# Patient Record
Sex: Female | Born: 1935 | Race: White | Hispanic: No | Smoking: Former smoker
Health system: Southern US, Community
[De-identification: ages and names within clinical notes are randomized; demographics above are authoritative.]

## PROBLEM LIST (undated history)

## (undated) DIAGNOSIS — E785 Hyperlipidemia, unspecified: Secondary | ICD-10-CM

## (undated) DIAGNOSIS — F419 Anxiety disorder, unspecified: Secondary | ICD-10-CM

## (undated) DIAGNOSIS — I2510 Atherosclerotic heart disease of native coronary artery without angina pectoris: Secondary | ICD-10-CM

## (undated) DIAGNOSIS — K469 Unspecified abdominal hernia without obstruction or gangrene: Secondary | ICD-10-CM

## (undated) DIAGNOSIS — I10 Essential (primary) hypertension: Secondary | ICD-10-CM

## (undated) HISTORY — PX: EXPLORATORY LAPAROTOMY: SUR591

---

## 2004-12-09 ENCOUNTER — Ambulatory Visit: Payer: Self-pay | Admitting: Internal Medicine

## 2004-12-16 ENCOUNTER — Ambulatory Visit: Payer: Self-pay | Admitting: Unknown Physician Specialty

## 2004-12-23 ENCOUNTER — Ambulatory Visit: Payer: Self-pay | Admitting: Gynecologic Oncology

## 2004-12-30 ENCOUNTER — Ambulatory Visit: Payer: Self-pay | Admitting: Unknown Physician Specialty

## 2005-02-17 ENCOUNTER — Ambulatory Visit: Payer: Self-pay | Admitting: Gynecologic Oncology

## 2005-09-08 ENCOUNTER — Ambulatory Visit: Payer: Self-pay | Admitting: Internal Medicine

## 2005-12-02 ENCOUNTER — Ambulatory Visit: Payer: Self-pay | Admitting: Unknown Physician Specialty

## 2007-08-10 ENCOUNTER — Ambulatory Visit: Payer: Self-pay | Admitting: Unknown Physician Specialty

## 2008-07-23 ENCOUNTER — Ambulatory Visit: Payer: Self-pay | Admitting: Internal Medicine

## 2008-08-12 ENCOUNTER — Ambulatory Visit: Payer: Self-pay | Admitting: Internal Medicine

## 2009-08-15 ENCOUNTER — Ambulatory Visit: Payer: Self-pay | Admitting: Internal Medicine

## 2010-08-18 ENCOUNTER — Ambulatory Visit: Payer: Self-pay | Admitting: Unknown Physician Specialty

## 2011-09-17 ENCOUNTER — Ambulatory Visit: Payer: Self-pay | Admitting: Internal Medicine

## 2012-09-19 ENCOUNTER — Ambulatory Visit: Payer: Self-pay | Admitting: Internal Medicine

## 2013-10-09 ENCOUNTER — Ambulatory Visit: Payer: Self-pay | Admitting: Internal Medicine

## 2014-08-10 ENCOUNTER — Emergency Department: Payer: Self-pay | Admitting: Emergency Medicine

## 2014-08-10 LAB — URINALYSIS, COMPLETE
BACTERIA: NONE SEEN
Bilirubin,UR: NEGATIVE
Blood: NEGATIVE
Glucose,UR: NEGATIVE mg/dL (ref 0–75)
KETONE: NEGATIVE
Leukocyte Esterase: NEGATIVE
Nitrite: NEGATIVE
PROTEIN: NEGATIVE
Ph: 6 (ref 4.5–8.0)
RBC, UR: NONE SEEN /HPF (ref 0–5)
SPECIFIC GRAVITY: 1.004 (ref 1.003–1.030)
SQUAMOUS EPITHELIAL: NONE SEEN
WBC UR: NONE SEEN /HPF (ref 0–5)

## 2014-08-10 LAB — CBC WITH DIFFERENTIAL/PLATELET
BASOS ABS: 0.1 10*3/uL (ref 0.0–0.1)
Basophil %: 0.8 %
EOS PCT: 1.8 %
Eosinophil #: 0.2 10*3/uL (ref 0.0–0.7)
HCT: 35.2 % (ref 35.0–47.0)
HGB: 11.9 g/dL — AB (ref 12.0–16.0)
Lymphocyte #: 2.6 10*3/uL (ref 1.0–3.6)
Lymphocyte %: 28.8 %
MCH: 30.6 pg (ref 26.0–34.0)
MCHC: 33.9 g/dL (ref 32.0–36.0)
MCV: 90 fL (ref 80–100)
Monocyte #: 1.1 x10 3/mm — ABNORMAL HIGH (ref 0.2–0.9)
Monocyte %: 11.9 %
NEUTROS ABS: 5 10*3/uL (ref 1.4–6.5)
NEUTROS PCT: 56.7 %
Platelet: 202 10*3/uL (ref 150–440)
RBC: 3.91 10*6/uL (ref 3.80–5.20)
RDW: 14.2 % (ref 11.5–14.5)
WBC: 8.9 10*3/uL (ref 3.6–11.0)

## 2014-08-10 LAB — BASIC METABOLIC PANEL
ANION GAP: 11 (ref 7–16)
BUN: 23 mg/dL — AB (ref 7–18)
CHLORIDE: 97 mmol/L — AB (ref 98–107)
CO2: 22 mmol/L (ref 21–32)
Calcium, Total: 8.8 mg/dL (ref 8.5–10.1)
Creatinine: 1.18 mg/dL (ref 0.60–1.30)
EGFR (Non-African Amer.): 43 — ABNORMAL LOW
GFR CALC AF AMER: 50 — AB
Glucose: 85 mg/dL (ref 65–99)
OSMOLALITY: 264 (ref 275–301)
Potassium: 3.5 mmol/L (ref 3.5–5.1)
SODIUM: 130 mmol/L — AB (ref 136–145)

## 2014-08-10 LAB — TROPONIN I

## 2014-12-03 ENCOUNTER — Ambulatory Visit: Payer: Self-pay | Admitting: Internal Medicine

## 2015-11-06 ENCOUNTER — Other Ambulatory Visit: Payer: Self-pay | Admitting: Internal Medicine

## 2015-11-06 DIAGNOSIS — Z1231 Encounter for screening mammogram for malignant neoplasm of breast: Secondary | ICD-10-CM

## 2015-12-05 ENCOUNTER — Ambulatory Visit: Payer: Self-pay

## 2015-12-17 ENCOUNTER — Other Ambulatory Visit: Payer: Self-pay | Admitting: Internal Medicine

## 2015-12-17 ENCOUNTER — Ambulatory Visit
Admission: RE | Admit: 2015-12-17 | Discharge: 2015-12-17 | Disposition: A | Discharge status: Home / Self Care | Payer: Medicare Other | Source: Ambulatory Visit | Attending: Internal Medicine | Admitting: Internal Medicine

## 2015-12-17 DIAGNOSIS — Z1231 Encounter for screening mammogram for malignant neoplasm of breast: Secondary | ICD-10-CM | POA: Diagnosis not present

## 2017-04-06 ENCOUNTER — Other Ambulatory Visit: Payer: Self-pay | Admitting: Internal Medicine

## 2017-04-06 DIAGNOSIS — Z1231 Encounter for screening mammogram for malignant neoplasm of breast: Secondary | ICD-10-CM

## 2017-04-25 ENCOUNTER — Inpatient Hospital Stay
Admission: RE | Admit: 2017-04-25 | Discharge: 2017-04-25 | Disposition: A | Discharge status: Home / Self Care | Payer: Self-pay | Source: Ambulatory Visit | Attending: *Deleted | Admitting: *Deleted

## 2017-04-25 ENCOUNTER — Ambulatory Visit
Admission: RE | Admit: 2017-04-25 | Discharge: 2017-04-25 | Disposition: A | Discharge status: Home / Self Care | Payer: Medicare Other | Source: Ambulatory Visit | Attending: Internal Medicine | Admitting: Internal Medicine

## 2017-04-25 ENCOUNTER — Other Ambulatory Visit: Payer: Self-pay | Admitting: *Deleted

## 2017-04-25 DIAGNOSIS — Z9289 Personal history of other medical treatment: Secondary | ICD-10-CM

## 2017-04-25 DIAGNOSIS — Z1231 Encounter for screening mammogram for malignant neoplasm of breast: Secondary | ICD-10-CM | POA: Diagnosis not present

## 2018-02-27 ENCOUNTER — Other Ambulatory Visit: Payer: Self-pay

## 2018-02-27 ENCOUNTER — Emergency Department
Admission: EM | Admit: 2018-02-27 | Discharge: 2018-02-28 | Disposition: A | Discharge status: Home / Self Care | Payer: Medicare Other | Attending: Emergency Medicine | Admitting: Emergency Medicine

## 2018-02-27 ENCOUNTER — Emergency Department: Payer: Medicare Other

## 2018-02-27 ENCOUNTER — Encounter: Payer: Self-pay | Admitting: Emergency Medicine

## 2018-02-27 DIAGNOSIS — R42 Dizziness and giddiness: Secondary | ICD-10-CM | POA: Diagnosis not present

## 2018-02-27 DIAGNOSIS — I10 Essential (primary) hypertension: Secondary | ICD-10-CM | POA: Insufficient documentation

## 2018-02-27 DIAGNOSIS — R030 Elevated blood-pressure reading, without diagnosis of hypertension: Secondary | ICD-10-CM | POA: Diagnosis present

## 2018-02-27 HISTORY — DX: Essential (primary) hypertension: I10

## 2018-02-27 LAB — COMPREHENSIVE METABOLIC PANEL
ALT: 22 U/L (ref 14–54)
ANION GAP: 8 (ref 5–15)
AST: 27 U/L (ref 15–41)
Albumin: 4.3 g/dL (ref 3.5–5.0)
Alkaline Phosphatase: 43 U/L (ref 38–126)
BILIRUBIN TOTAL: 1 mg/dL (ref 0.3–1.2)
BUN: 13 mg/dL (ref 6–20)
CHLORIDE: 101 mmol/L (ref 101–111)
CO2: 24 mmol/L (ref 22–32)
Calcium: 10.5 mg/dL — ABNORMAL HIGH (ref 8.9–10.3)
Creatinine, Ser: 0.9 mg/dL (ref 0.44–1.00)
GFR, EST NON AFRICAN AMERICAN: 57 mL/min — AB (ref >60)
Glucose, Bld: 117 mg/dL — ABNORMAL HIGH (ref 65–99)
POTASSIUM: 3.5 mmol/L (ref 3.5–5.1)
Sodium: 133 mmol/L — ABNORMAL LOW (ref 135–145)
TOTAL PROTEIN: 7.5 g/dL (ref 6.5–8.1)

## 2018-02-27 LAB — CBC WITH DIFFERENTIAL/PLATELET
BASOS ABS: 0.1 10*3/uL (ref 0–0.1)
Basophils Relative: 1 %
Eosinophils Absolute: 0.1 10*3/uL (ref 0–0.7)
Eosinophils Relative: 2 %
HEMATOCRIT: 37.7 % (ref 35.0–47.0)
Hemoglobin: 12.5 g/dL (ref 12.0–16.0)
LYMPHS PCT: 24 %
Lymphs Abs: 1.7 10*3/uL (ref 1.0–3.6)
MCH: 29.3 pg (ref 26.0–34.0)
MCHC: 33.2 g/dL (ref 32.0–36.0)
MCV: 88.1 fL (ref 80.0–100.0)
MONO ABS: 0.7 10*3/uL (ref 0.2–0.9)
MONOS PCT: 10 %
Neutro Abs: 4.4 10*3/uL (ref 1.4–6.5)
Neutrophils Relative %: 63 %
Platelets: 210 10*3/uL (ref 150–440)
RBC: 4.28 MIL/uL (ref 3.80–5.20)
RDW: 15.2 % — AB (ref 11.5–14.5)
WBC: 7 10*3/uL (ref 3.6–11.0)

## 2018-02-27 NOTE — ED Provider Notes (Signed)
[facility] Emergency Department Provider Note  ____________________________________________   First MD Initiated Contact with Patient 02/27/18 2127     (approximate)  I have reviewed the triage vital signs and the nursing notes.   HISTORY  Chief Complaint Hypertension   HPI Kelsey Martin is a 84 y.o. female self presents to the emergency department requesting her blood pressure to be checked after checking it at home and noting it was elevated.  For the past 2 days or so it has been in the 180s-200s at home.  Is associated with mild lightheadedness.  No double vision or blurred vision.  No numbness or weakness.  No chest pain or shortness of breath.  She takes 3 antihypertensives and notes no change in any of her medications.  This evening she checked it noted it was high checked it again and it was even higher so she decided to come to the emergency department for reevaluation.  Past Medical History:  Diagnosis Date  . Hypertension     There are no active problems to display for this patient.   History reviewed. No pertinent surgical history.  Prior to Admission medications   Not on File    Allergies Patient has no allergy information on record.  Family History  Problem Relation Age of Onset  . Breast cancer Neg Hx     Social History Social History   Tobacco Use  . Smoking status: Former Smoker  Substance Use Topics  . Alcohol use: Not on file  . Drug use: Not on file    Review of Systems Constitutional: No fever/chills ENT: No sore throat. Cardiovascular: Denies chest pain. Respiratory: Denies shortness of breath. Gastrointestinal: No abdominal pain.  No nausea, no vomiting.  No diarrhea.  No constipation. Musculoskeletal: Negative for back pain. Neurological: Negative for headaches   ____________________________________________   PHYSICAL EXAM:  VITAL SIGNS: ED Triage Vitals  Enc Vitals Group     BP 02/27/18 1758 (!)  225/88     Pulse Rate 02/27/18 1758 76     Resp 02/27/18 1758 18     Temp 02/27/18 1758 98.7 F (37.1 C)     Temp Source 02/27/18 1758 Oral     SpO2 02/27/18 1758 96 %     Weight 02/27/18 1757 160 lb (72.6 kg)     Height 02/27/18 1757 5\' 7"  (1.702 m)     Head Circumference --      Peak Flow --      Pain Score --      Pain Loc --      Pain Edu? --      Excl. in [place]? --     Constitutional: Alert and oriented x4 well-appearing nontoxic no diaphoresis speaks in full clear sentences Head: Atraumatic. Nose: No congestion/rhinnorhea. Mouth/Throat: No trismus Neck: No stridor.   Cardiovascular: Regular rate and rhythm Respiratory: Normal respiratory effort.  No retractions. Gastrointestinal: Soft nontender Neurologic:  Normal speech and language. No gross focal neurologic deficits are appreciated.  Skin:  Skin is warm, dry and intact. No rash noted.    ____________________________________________  LABS (all labs ordered are listed, but only abnormal results are displayed)  Labs Reviewed  CBC WITH DIFFERENTIAL/PLATELET - Abnormal; Notable for the following components:      Result Value   RDW 15.2 (*)    All other components within normal limits  COMPREHENSIVE METABOLIC PANEL - Abnormal; Notable for the following components:   Sodium 133 (*)    Glucose,  Bld 117 (*)    Calcium 10.5 (*)    GFR calc non Af Amer 57 (*)    All other components within normal limits    Lab work reviewed by me with no acute disease __________________________________________  EKG   ____________________________________________  RADIOLOGY  Head CT reviewed by me with no acute disease ____________________________________________   DIFFERENTIAL includes but not limited to  A symptomatic hypertension, renal failure, hypertensive emergency, intracerebral hemorrhage, stroke   PROCEDURES  Procedure(s) performed: no  Procedures  Critical Care performed: no  Observation:  no ____________________________________________   INITIAL IMPRESSION / ASSESSMENT AND PLAN / ED COURSE  Pertinent labs & imaging results that were available during my care of the patient were reviewed by me and considered in my medical decision making (see chart for details).  The patient arrives quite well-appearing with elevated blood pressure although neuro intact.  Head CT obtained secondary to the patient's vague lightheadedness and fortunately is negative for acute pathology.  At this point the patient's pressures come down and she feels significant reassurance.  No evidence of renal dysfunction.  I will refer her back to her primary care physician and of advised her to continue taking her antihypertensives as prescribed.  Strict return precautions have been given and the patient verbalizes understanding and agreement with the plan.      ____________________________________________   FINAL CLINICAL IMPRESSION(S) / ED DIAGNOSES  Final diagnoses:  Hypertension, unspecified type  Lightheaded      NEW MEDICATIONS STARTED DURING THIS VISIT:  There are no discharge medications for this patient.    Note:  This document was prepared using Dragon voice recognition software and may include unintentional dictation errors.      Darel Hong, MD 03/02/18 1322

## 2018-02-27 NOTE — Discharge Instructions (Signed)
Fortunately today your blood pressure and your head CT were reassuring.  Please make an appointment to follow-up with your primary care physician tomorrow for reevaluation and return to the emergency department sooner for any concerns.  It was a pleasure to take care of you today, and thank you for coming to our emergency department.  If you have any questions or concerns before leaving please ask the nurse to grab me and I'm more than happy to go through your aftercare instructions again.  If you were prescribed any opioid pain medication today such as Norco, Vicodin, Percocet, morphine, hydrocodone, or oxycodone please make sure you do not drive when you are taking this medication as it can alter your ability to drive safely.  If you have any concerns once you are home that you are not improving or are in fact getting worse before you can make it to your follow-up appointment, please do not hesitate to call 911 and come back for further evaluation.  Kelsey Hong, MD  Results for orders placed or performed during the hospital encounter of 02/27/18  CBC with Differential  Result Value Ref Range   WBC 7.0 3.6 - 11.0 K/uL   RBC 4.28 3.80 - 5.20 MIL/uL   Hemoglobin 12.5 12.0 - 16.0 g/dL   HCT 37.7 35.0 - 47.0 %   MCV 88.1 80.0 - 100.0 fL   MCH 29.3 26.0 - 34.0 pg   MCHC 33.2 32.0 - 36.0 g/dL   RDW 15.2 (H) 11.5 - 14.5 %   Platelets 210 150 - 440 K/uL   Neutrophils Relative % 63 %   Neutro Abs 4.4 1.4 - 6.5 K/uL   Lymphocytes Relative 24 %   Lymphs Abs 1.7 1.0 - 3.6 K/uL   Monocytes Relative 10 %   Monocytes Absolute 0.7 0.2 - 0.9 K/uL   Eosinophils Relative 2 %   Eosinophils Absolute 0.1 0 - 0.7 K/uL   Basophils Relative 1 %   Basophils Absolute 0.1 0 - 0.1 K/uL  Comprehensive metabolic panel  Result Value Ref Range   Sodium 133 (L) 135 - 145 mmol/L   Potassium 3.5 3.5 - 5.1 mmol/L   Chloride 101 101 - 111 mmol/L   CO2 24 22 - 32 mmol/L   Glucose, Bld 117 (H) 65 - 99 mg/dL   BUN  13 6 - 20 mg/dL   Creatinine, Ser 0.90 0.44 - 1.00 mg/dL   Calcium 10.5 (H) 8.9 - 10.3 mg/dL   Total Protein 7.5 6.5 - 8.1 g/dL   Albumin 4.3 3.5 - 5.0 g/dL   AST 27 15 - 41 U/L   ALT 22 14 - 54 U/L   Alkaline Phosphatase 43 38 - 126 U/L   Total Bilirubin 1.0 0.3 - 1.2 mg/dL   GFR calc non Af Amer 57 (L) >60 mL/min   GFR calc Af Amer >60 >60 mL/min   Anion gap 8 5 - 15   Ct Head Wo Contrast  Result Date: 02/27/2018 CLINICAL DATA:  Hypertension x2 days. EXAM: CT HEAD WITHOUT CONTRAST TECHNIQUE: Contiguous axial images were obtained from the base of the skull through the vertex without intravenous contrast. COMPARISON:  None. FINDINGS: Brain: Chronic appearing lacunar infarct involving the right caudate. Minimal small vessel ischemic disease of periventricular white matter. No large vascular territory infarct, hemorrhage or midline shift. No intra-axial mass nor extra-axial fluid collections. Midline fourth ventricle and basal cisterns. Vascular: No hyperdense vessel sign. Moderate atherosclerosis of the carotid siphons. Skull: Negative for fracture  or focal lesion. Sinuses/Orbits: No acute finding. Other: None IMPRESSION: Minimal small vessel ischemic disease. Chronic appearing right caudate lacunar infarct. No acute intracranial abnormality. Electronically Signed   By: Ashley Royalty M.D.   On: 02/27/2018 22:21

## 2018-02-27 NOTE — ED Triage Notes (Signed)
States monitors blood pressure and noticed it high x 2 days. Denies chest pressure or SOB.

## 2018-04-10 ENCOUNTER — Other Ambulatory Visit: Payer: Self-pay | Admitting: Internal Medicine

## 2018-04-10 DIAGNOSIS — Z1239 Encounter for other screening for malignant neoplasm of breast: Secondary | ICD-10-CM

## 2019-04-18 ENCOUNTER — Other Ambulatory Visit: Payer: Self-pay | Admitting: Internal Medicine

## 2019-04-18 DIAGNOSIS — R270 Ataxia, unspecified: Secondary | ICD-10-CM

## 2019-04-23 ENCOUNTER — Ambulatory Visit
Admission: RE | Admit: 2019-04-23 | Discharge: 2019-04-23 | Disposition: A | Discharge status: Home / Self Care | Payer: Medicare Other | Source: Ambulatory Visit | Attending: Internal Medicine | Admitting: Internal Medicine

## 2019-04-23 ENCOUNTER — Other Ambulatory Visit: Payer: Self-pay

## 2019-04-23 DIAGNOSIS — R270 Ataxia, unspecified: Secondary | ICD-10-CM | POA: Diagnosis present

## 2019-07-03 ENCOUNTER — Other Ambulatory Visit: Payer: Self-pay

## 2019-07-03 ENCOUNTER — Emergency Department: Payer: Medicare Other

## 2019-07-03 ENCOUNTER — Emergency Department
Admission: EM | Admit: 2019-07-03 | Discharge: 2019-07-03 | Disposition: A | Discharge status: Home / Self Care | Payer: Medicare Other | Attending: Emergency Medicine | Admitting: Emergency Medicine

## 2019-07-03 ENCOUNTER — Encounter: Payer: Self-pay | Admitting: Emergency Medicine

## 2019-07-03 DIAGNOSIS — R531 Weakness: Secondary | ICD-10-CM | POA: Diagnosis not present

## 2019-07-03 DIAGNOSIS — Z87891 Personal history of nicotine dependence: Secondary | ICD-10-CM | POA: Diagnosis not present

## 2019-07-03 DIAGNOSIS — I10 Essential (primary) hypertension: Secondary | ICD-10-CM | POA: Insufficient documentation

## 2019-07-03 HISTORY — DX: Unspecified abdominal hernia without obstruction or gangrene: K46.9

## 2019-07-03 LAB — COMPREHENSIVE METABOLIC PANEL
ALT: 24 U/L (ref 0–44)
AST: 27 U/L (ref 15–41)
Albumin: 4.3 g/dL (ref 3.5–5.0)
Alkaline Phosphatase: 46 U/L (ref 38–126)
Anion gap: 11 (ref 5–15)
BUN: 13 mg/dL (ref 8–23)
CO2: 22 mmol/L (ref 22–32)
Calcium: 9.7 mg/dL (ref 8.9–10.3)
Chloride: 99 mmol/L (ref 98–111)
Creatinine, Ser: 0.77 mg/dL (ref 0.44–1.00)
GFR calc Af Amer: >60 mL/min (ref >60)
GFR calc non Af Amer: >60 mL/min (ref >60)
Glucose, Bld: 130 mg/dL — ABNORMAL HIGH (ref 70–99)
Potassium: 3.6 mmol/L (ref 3.5–5.1)
Sodium: 132 mmol/L — ABNORMAL LOW (ref 135–145)
Total Bilirubin: 0.8 mg/dL (ref 0.3–1.2)
Total Protein: 7 g/dL (ref 6.5–8.1)

## 2019-07-03 LAB — URINALYSIS, COMPLETE (UACMP) WITH MICROSCOPIC
Bacteria, UA: NONE SEEN
Bilirubin Urine: NEGATIVE
Glucose, UA: NEGATIVE mg/dL
Hgb urine dipstick: NEGATIVE
Ketones, ur: NEGATIVE mg/dL
Leukocytes,Ua: NEGATIVE
Nitrite: NEGATIVE
Protein, ur: NEGATIVE mg/dL
Specific Gravity, Urine: 1.004 — ABNORMAL LOW (ref 1.005–1.030)
Squamous Epithelial / LPF: NONE SEEN (ref 0–5)
pH: 8 (ref 5.0–8.0)

## 2019-07-03 LAB — CBC WITH DIFFERENTIAL/PLATELET
Abs Immature Granulocytes: 0.01 10*3/uL (ref 0.00–0.07)
Basophils Absolute: 0.1 10*3/uL (ref 0.0–0.1)
Basophils Relative: 1 %
Eosinophils Absolute: 0.2 10*3/uL (ref 0.0–0.5)
Eosinophils Relative: 2 %
HCT: 37.3 % (ref 36.0–46.0)
Hemoglobin: 12.8 g/dL (ref 12.0–15.0)
Immature Granulocytes: 0 %
Lymphocytes Relative: 24 %
Lymphs Abs: 1.8 10*3/uL (ref 0.7–4.0)
MCH: 30.4 pg (ref 26.0–34.0)
MCHC: 34.3 g/dL (ref 30.0–36.0)
MCV: 88.6 fL (ref 80.0–100.0)
Monocytes Absolute: 0.7 10*3/uL (ref 0.1–1.0)
Monocytes Relative: 10 %
Neutro Abs: 4.5 10*3/uL (ref 1.7–7.7)
Neutrophils Relative %: 63 %
Platelets: 187 10*3/uL (ref 150–400)
RBC: 4.21 MIL/uL (ref 3.87–5.11)
RDW: 13 % (ref 11.5–15.5)
WBC: 7.2 10*3/uL (ref 4.0–10.5)
nRBC: 0 % (ref 0.0–0.2)

## 2019-07-03 LAB — TROPONIN I (HIGH SENSITIVITY): Troponin I (High Sensitivity): 15 ng/L (ref <18)

## 2019-07-03 MED ORDER — HYDRALAZINE HCL 50 MG PO TABS: 50.0000 mg | ORAL_TABLET | Freq: Four times a day (QID) | ORAL | 1 refills | Status: DC

## 2019-07-03 MED ORDER — SPIRONOLACTONE 25 MG PO TABS: 25.0000 mg | ORAL_TABLET | Freq: Every day | ORAL | 11 refills | Status: DC

## 2019-07-03 MED ORDER — HYDRALAZINE HCL 50 MG PO TABS
100.0000 mg | ORAL_TABLET | Freq: Once | ORAL | Status: AC
  Administered 2019-07-03: 100 mg via ORAL
  Filled 2019-07-03: qty 2

## 2019-07-03 MED ORDER — HYDRALAZINE HCL 20 MG/ML IJ SOLN
10.0000 mg | Freq: Once | INTRAMUSCULAR | Status: AC
  Administered 2019-07-03: 10 mg via INTRAVENOUS
  Filled 2019-07-03: qty 1

## 2019-07-03 NOTE — ED Notes (Signed)

## 2019-07-03 NOTE — ED Provider Notes (Signed)
[facility] Emergency Department Provider Note       Time seen: ----------------------------------------- 10:01 AM on 07/03/2019 -----------------------------------------   I have reviewed the triage vital signs and the nursing notes.  HISTORY   Chief Complaint Weakness   HPI Kelsey OVIATT is a 85 y.o. female with a history of hypertension who presents to the ED for generalized weakness.  Patient reports weakness that has been going on for some time that is gotten worse yesterday.  She had some blurry vision last night.  Blood pressure was markedly elevated by EMS and on arrival here.  She denies any current blurry vision.  She denies any recent illness or other complaints.  Past Medical History:  Diagnosis Date  . Hypertension     There are no active problems to display for this patient.   No past surgical history on file.  Allergies Patient has no allergy information on record.  Social History Social History   Tobacco Use  . Smoking status: Former Smoker  Substance Use Topics  . Alcohol use: Not on file  . Drug use: Not on file   Review of Systems Constitutional: Negative for fever. Cardiovascular: Negative for chest pain. Respiratory: Negative for shortness of breath. Gastrointestinal: Negative for abdominal pain, vomiting and diarrhea. Musculoskeletal: Negative for back pain. Skin: Negative for rash. Neurological: Positive for weakness  All systems negative/normal/unremarkable except as stated in the HPI  ____________________________________________   PHYSICAL EXAM:  VITAL SIGNS: ED Triage Vitals  Enc Vitals Group     BP      Pulse      Resp      Temp      Temp src      SpO2      Weight      Height      Head Circumference      Peak Flow      Pain Score      Pain Loc      Pain Edu?      Excl. in [place]?    Constitutional: Alert and oriented. Well appearing and in no distress. Eyes: Conjunctivae are normal. Normal  extraocular movements. ENT      Head: Normocephalic and atraumatic.      Nose: No congestion/rhinnorhea.      Mouth/Throat: Mucous membranes are moist.      Neck: No stridor. Cardiovascular: Normal rate, regular rhythm. No murmurs, rubs, or gallops. Respiratory: Normal respiratory effort without tachypnea nor retractions. Breath sounds are clear and equal bilaterally. No wheezes/rales/rhonchi. Gastrointestinal: Soft and nontender. Normal bowel sounds Musculoskeletal: Nontender with normal range of motion in extremities. No lower extremity tenderness nor edema. Neurologic:  Normal speech and language. No gross focal neurologic deficits are appreciated.  Skin:  Skin is warm, dry and intact. No rash noted. Psychiatric: Mood and affect are normal. Speech and behavior are normal.  ____________________________________________  EKG: Interpreted by me.  Sinus rhythm with a rate of 73 bpm, prolonged PR interval, normal QRS, normal axis, normal QT  ____________________________________________  ED COURSE:  As part of my medical decision making, I reviewed the following data within the [place] History obtained from family if available, nursing notes, old chart and ekg, as well as notes from prior ED visits. Patient presented for weakness, we will assess with labs and imaging as indicated at this time.   Procedures  MARIJO QUIZON was evaluated in Emergency Department on 07/03/2019 for the symptoms described in the history of  present illness. She was evaluated in the context of the global COVID-19 pandemic, which necessitated consideration that the patient might be at risk for infection with the SARS-CoV-2 virus that causes COVID-19. Institutional protocols and algorithms that pertain to the evaluation of patients at risk for COVID-19 are in a state of rapid change based on information released by regulatory bodies including the CDC and federal and state organizations. These policies  and algorithms were followed during the patient's care in the ED.  ____________________________________________   LABS (pertinent positives/negatives)  Labs Reviewed  COMPREHENSIVE METABOLIC PANEL - Abnormal; Notable for the following components:      Result Value   Sodium 132 (*)    Glucose, Bld 130 (*)    All other components within normal limits  URINALYSIS, COMPLETE (UACMP) WITH MICROSCOPIC - Abnormal; Notable for the following components:   Color, Urine COLORLESS (*)    APPearance CLEAR (*)    Specific Gravity, Urine 1.004 (*)    All other components within normal limits  CBC WITH DIFFERENTIAL/PLATELET  TROPONIN I (HIGH SENSITIVITY)    RADIOLOGY Images were viewed by me  CT head IMPRESSION: No focal acute intracranial abnormality identified.  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 Chronic diffuse atrophy. Chronic bilateral periventricular white matter small vessel ischemic change.  ____________________________________________   DIFFERENTIAL DIAGNOSIS   Dehydration, electrolyte abnormality, depression, occult infection, medication noncompliance, medication side effect  FINAL ASSESSMENT AND PLAN  Weakness, hypertension   Plan: The patient had presented for weakness and was noted to be hypertensive. Patient's labs did not reveal any acute process. Patient's imaging is overall reassuring, small old infarcts have been noted for some time.  She does seem somewhat depressed and family states she lives by herself.  We have treated her blood pressure here with extra hydralazine and she will go home on 4 times a day hydralazine and I may add HCTZ at a low dose as well.  Otherwise she is cleared for outpatient follow-up.   Laurence Aly, MD    Note: This note was generated in part or whole with voice recognition software. Voice recognition is usually quite accurate but there are transcription errors that can and very often do occur. I  apologize for any typographical errors that were not detected and corrected.     Earleen Newport, MD 07/03/19 1248

## 2019-07-03 NOTE — ED Triage Notes (Addendum)
Patient to ER for c/o weakness since yesterday. Patient also c/o blurred vision last night, as well as headache at back of head. Patient's BP for EMS was initially 240/110 (223/108 second time), 97.6 temp for EMS. Patient ambulatory without difficulty. Denies any current blurred vision.  Patient took Benazepril, Isosorbide, Hydralazine this am as usual.

## 2019-08-01 ENCOUNTER — Emergency Department: Payer: Medicare Other

## 2019-08-01 ENCOUNTER — Inpatient Hospital Stay
Admission: EM | Admit: 2019-08-01 | Discharge: 2019-08-03 | DRG: 641 | Disposition: A | Discharge status: Home / Self Care | Payer: Medicare Other | Attending: Internal Medicine | Admitting: Internal Medicine

## 2019-08-01 ENCOUNTER — Other Ambulatory Visit: Payer: Self-pay

## 2019-08-01 ENCOUNTER — Encounter: Payer: Self-pay | Admitting: Emergency Medicine

## 2019-08-01 ENCOUNTER — Inpatient Hospital Stay: Payer: Medicare Other

## 2019-08-01 DIAGNOSIS — T502X5A Adverse effect of carbonic-anhydrase inhibitors, benzothiadiazides and other diuretics, initial encounter: Secondary | ICD-10-CM | POA: Diagnosis present

## 2019-08-01 DIAGNOSIS — E860 Dehydration: Secondary | ICD-10-CM | POA: Diagnosis present

## 2019-08-01 DIAGNOSIS — K219 Gastro-esophageal reflux disease without esophagitis: Secondary | ICD-10-CM | POA: Diagnosis present

## 2019-08-01 DIAGNOSIS — Z87891 Personal history of nicotine dependence: Secondary | ICD-10-CM | POA: Diagnosis not present

## 2019-08-01 DIAGNOSIS — R627 Adult failure to thrive: Secondary | ICD-10-CM | POA: Diagnosis present

## 2019-08-01 DIAGNOSIS — Z20828 Contact with and (suspected) exposure to other viral communicable diseases: Secondary | ICD-10-CM | POA: Diagnosis present

## 2019-08-01 DIAGNOSIS — Z7982 Long term (current) use of aspirin: Secondary | ICD-10-CM

## 2019-08-01 DIAGNOSIS — I160 Hypertensive urgency: Secondary | ICD-10-CM | POA: Diagnosis present

## 2019-08-01 DIAGNOSIS — E871 Hypo-osmolality and hyponatremia: Principal | ICD-10-CM | POA: Diagnosis present

## 2019-08-01 DIAGNOSIS — Z79899 Other long term (current) drug therapy: Secondary | ICD-10-CM | POA: Diagnosis not present

## 2019-08-01 DIAGNOSIS — R531 Weakness: Secondary | ICD-10-CM

## 2019-08-01 DIAGNOSIS — E785 Hyperlipidemia, unspecified: Secondary | ICD-10-CM | POA: Diagnosis present

## 2019-08-01 DIAGNOSIS — I129 Hypertensive chronic kidney disease with stage 1 through stage 4 chronic kidney disease, or unspecified chronic kidney disease: Secondary | ICD-10-CM | POA: Diagnosis present

## 2019-08-01 DIAGNOSIS — Z66 Do not resuscitate: Secondary | ICD-10-CM | POA: Diagnosis present

## 2019-08-01 DIAGNOSIS — M109 Gout, unspecified: Secondary | ICD-10-CM | POA: Diagnosis present

## 2019-08-01 DIAGNOSIS — N184 Chronic kidney disease, stage 4 (severe): Secondary | ICD-10-CM | POA: Diagnosis present

## 2019-08-01 LAB — BASIC METABOLIC PANEL
Anion gap: 10 (ref 5–15)
BUN: 12 mg/dL (ref 8–23)
CO2: 20 mmol/L — ABNORMAL LOW (ref 22–32)
Calcium: 9.5 mg/dL (ref 8.9–10.3)
Chloride: 90 mmol/L — ABNORMAL LOW (ref 98–111)
Creatinine, Ser: 0.93 mg/dL (ref 0.44–1.00)
GFR calc Af Amer: >60 mL/min (ref >60)
GFR calc non Af Amer: 56 mL/min — ABNORMAL LOW (ref >60)
Glucose, Bld: 109 mg/dL — ABNORMAL HIGH (ref 70–99)
Potassium: 4.6 mmol/L (ref 3.5–5.1)
Sodium: 120 mmol/L — ABNORMAL LOW (ref 135–145)

## 2019-08-01 LAB — URINALYSIS, COMPLETE (UACMP) WITH MICROSCOPIC
Bacteria, UA: NONE SEEN
Bilirubin Urine: NEGATIVE
Glucose, UA: NEGATIVE mg/dL
Hgb urine dipstick: NEGATIVE
Ketones, ur: NEGATIVE mg/dL
Leukocytes,Ua: NEGATIVE
Nitrite: NEGATIVE
Protein, ur: NEGATIVE mg/dL
Specific Gravity, Urine: 1.004 — ABNORMAL LOW (ref 1.005–1.030)
Squamous Epithelial / LPF: NONE SEEN (ref 0–5)
pH: 7 (ref 5.0–8.0)

## 2019-08-01 LAB — CBC
HCT: 34.1 % — ABNORMAL LOW (ref 36.0–46.0)
Hemoglobin: 12.3 g/dL (ref 12.0–15.0)
MCH: 31.1 pg (ref 26.0–34.0)
MCHC: 36.1 g/dL — ABNORMAL HIGH (ref 30.0–36.0)
MCV: 86.1 fL (ref 80.0–100.0)
Platelets: 158 10*3/uL (ref 150–400)
RBC: 3.96 MIL/uL (ref 3.87–5.11)
RDW: 12.3 % (ref 11.5–15.5)
WBC: 6.7 10*3/uL (ref 4.0–10.5)
nRBC: 0 % (ref 0.0–0.2)

## 2019-08-01 LAB — SODIUM: Sodium: 122 mmol/L — ABNORMAL LOW (ref 135–145)

## 2019-08-01 LAB — SARS CORONAVIRUS 2 BY RT PCR (HOSPITAL ORDER, PERFORMED IN ~~LOC~~ HOSPITAL LAB): SARS Coronavirus 2: NEGATIVE

## 2019-08-01 LAB — OSMOLALITY, URINE: Osmolality, Ur: 221 mOsm/kg — ABNORMAL LOW (ref 300–900)

## 2019-08-01 LAB — SODIUM, URINE, RANDOM: Sodium, Ur: 75 mmol/L

## 2019-08-01 MED ORDER — BENAZEPRIL HCL 20 MG PO TABS
20.0000 mg | ORAL_TABLET | Freq: Every day | ORAL | Status: DC
  Administered 2019-08-01 – 2019-08-02 (×2): 20 mg via ORAL
  Filled 2019-08-01 (×3): qty 1

## 2019-08-01 MED ORDER — NIACIN 500 MG PO TABS
500.0000 mg | ORAL_TABLET | Freq: Every day | ORAL | Status: DC
  Administered 2019-08-02 – 2019-08-03 (×2): 500 mg via ORAL
  Filled 2019-08-01 (×2): qty 1

## 2019-08-01 MED ORDER — ATORVASTATIN CALCIUM 20 MG PO TABS
20.0000 mg | ORAL_TABLET | Freq: Every day | ORAL | Status: DC
  Administered 2019-08-01 – 2019-08-02 (×2): 20 mg via ORAL
  Filled 2019-08-01 (×2): qty 1

## 2019-08-01 MED ORDER — ACETAMINOPHEN 325 MG PO TABS: 650.0000 mg | ORAL_TABLET | Freq: Four times a day (QID) | ORAL | Status: DC | PRN

## 2019-08-01 MED ORDER — HYDRALAZINE HCL 50 MG PO TABS
50.0000 mg | ORAL_TABLET | Freq: Four times a day (QID) | ORAL | Status: DC
  Administered 2019-08-01 – 2019-08-03 (×7): 50 mg via ORAL
  Filled 2019-08-01 (×6): qty 1

## 2019-08-01 MED ORDER — FAMOTIDINE 20 MG PO TABS
20.0000 mg | ORAL_TABLET | Freq: Two times a day (BID) | ORAL | Status: DC
  Administered 2019-08-01 – 2019-08-03 (×5): 20 mg via ORAL
  Filled 2019-08-01 (×5): qty 1

## 2019-08-01 MED ORDER — SODIUM CHLORIDE 0.9 % IV SOLN
INTRAVENOUS | Status: DC
  Administered 2019-08-01 – 2019-08-02 (×2): via INTRAVENOUS

## 2019-08-01 MED ORDER — HYDRALAZINE HCL 20 MG/ML IJ SOLN
10.0000 mg | Freq: Four times a day (QID) | INTRAMUSCULAR | Status: DC | PRN
  Administered 2019-08-01: 10 mg via INTRAVENOUS
  Filled 2019-08-01: qty 1

## 2019-08-01 MED ORDER — ASPIRIN EC 81 MG PO TBEC
81.0000 mg | DELAYED_RELEASE_TABLET | Freq: Every day | ORAL | Status: DC
  Administered 2019-08-01 – 2019-08-03 (×3): 81 mg via ORAL
  Filled 2019-08-01 (×3): qty 1

## 2019-08-01 MED ORDER — SODIUM CHLORIDE 0.9 % IV SOLN
Freq: Once | INTRAVENOUS | Status: AC
  Administered 2019-08-01: 12:00:00 via INTRAVENOUS

## 2019-08-01 MED ORDER — ALPRAZOLAM 0.5 MG PO TABS
0.5000 mg | ORAL_TABLET | Freq: Every evening | ORAL | Status: DC | PRN
  Administered 2019-08-01 – 2019-08-02 (×2): 0.5 mg via ORAL
  Filled 2019-08-01 (×2): qty 1

## 2019-08-01 MED ORDER — HYDROCODONE-ACETAMINOPHEN 5-325 MG PO TABS
1.0000 | ORAL_TABLET | Freq: Four times a day (QID) | ORAL | Status: DC | PRN
  Administered 2019-08-02: 1 via ORAL
  Filled 2019-08-01: qty 1

## 2019-08-01 MED ORDER — BRIMONIDINE TARTRATE 0.2 % OP SOLN
1.0000 [drp] | Freq: Every day | OPHTHALMIC | Status: DC
  Administered 2019-08-01 – 2019-08-02 (×3): 1 [drp] via OPHTHALMIC
  Filled 2019-08-01: qty 5

## 2019-08-01 MED ORDER — ISOSORBIDE MONONITRATE ER 60 MG PO TB24
60.0000 mg | ORAL_TABLET | Freq: Every day | ORAL | Status: DC
  Administered 2019-08-01 – 2019-08-03 (×3): 60 mg via ORAL
  Filled 2019-08-01 (×3): qty 1

## 2019-08-01 MED ORDER — HYDRALAZINE HCL 50 MG PO TABS
50.0000 mg | ORAL_TABLET | Freq: Once | ORAL | Status: AC
  Administered 2019-08-01: 50 mg via ORAL
  Filled 2019-08-01: qty 1

## 2019-08-01 MED ORDER — ENOXAPARIN SODIUM 40 MG/0.4ML ~~LOC~~ SOLN
40.0000 mg | SUBCUTANEOUS | Status: DC
  Administered 2019-08-01 – 2019-08-02 (×2): 40 mg via SUBCUTANEOUS
  Filled 2019-08-01 (×2): qty 0.4

## 2019-08-01 MED ORDER — ACETAMINOPHEN 650 MG RE SUPP: 650.0000 mg | Freq: Four times a day (QID) | RECTAL | Status: DC | PRN

## 2019-08-01 MED ORDER — LATANOPROST 0.005 % OP SOLN
1.0000 [drp] | Freq: Every day | OPHTHALMIC | Status: DC
  Administered 2019-08-01 – 2019-08-02 (×2): 1 [drp] via OPHTHALMIC
  Filled 2019-08-01: qty 2.5

## 2019-08-01 MED ORDER — DORZOLAMIDE HCL-TIMOLOL MAL 2-0.5 % OP SOLN
1.0000 [drp] | Freq: Two times a day (BID) | OPHTHALMIC | Status: DC
  Administered 2019-08-02 – 2019-08-03 (×2): 1 [drp] via OPHTHALMIC
  Filled 2019-08-01: qty 10

## 2019-08-01 MED ORDER — ONDANSETRON 4 MG PO TBDP
4.0000 mg | ORAL_TABLET | Freq: Three times a day (TID) | ORAL | Status: DC | PRN
  Filled 2019-08-01: qty 1

## 2019-08-01 MED ORDER — BENAZEPRIL HCL 20 MG PO TABS
20.0000 mg | ORAL_TABLET | Freq: Every day | ORAL | Status: DC
  Filled 2019-08-01 (×2): qty 1

## 2019-08-01 MED ORDER — METOPROLOL TARTRATE 25 MG PO TABS
25.0000 mg | ORAL_TABLET | Freq: Two times a day (BID) | ORAL | Status: DC
  Administered 2019-08-01 – 2019-08-03 (×5): 25 mg via ORAL
  Filled 2019-08-01 (×6): qty 1

## 2019-08-01 NOTE — ED Provider Notes (Signed)
[facility] Emergency Department Provider Note    First MD Initiated Contact with Patient 08/01/19 1118     (approximate)  I have reviewed the triage vital signs and the nursing notes.   HISTORY  Chief Complaint Weakness    HPI Kelsey Martin is a 85 y.o. female history of hypertension presents the ER for generalized fatigue and weakness over the past several weeks.  Patient admits that she is had decreased oral intake due to loss of appetite.  Denies any fevers.  No chest pain or shortness of breath.  Came to the ER today because she was having severe weakness and fatigue and did not feel she could wait to see her PCP.    Past Medical History:  Diagnosis Date  . Abdominal hernia   . Hypertension    Family History  Problem Relation Age of Onset  . Breast cancer Neg Hx    Past Surgical History:  Procedure Laterality Date  . EXPLORATORY LAPAROTOMY     There are no active problems to display for this patient.     Prior to Admission medications   Medication Sig Start Date End Date Taking? Authorizing Provider  ALPRAZolam Duanne Moron) 0.5 MG tablet Take 0.5 mg by mouth at bedtime as needed for sleep. 04/19/19   [provider]  aspirin EC 81 MG tablet Take 81 mg by mouth daily.    [provider]  atorvastatin (LIPITOR) 20 MG tablet Take 20 mg by mouth daily. 03/14/19   [provider]  benazepril (LOTENSIN) 20 MG tablet Take 20 mg by mouth 2 (two) times a day. 03/19/19   [provider]  bimatoprost (LUMIGAN) 0.01 % SOLN Place 1 drop into both eyes at bedtime.    [provider]  Calcium Carbonate-Vitamin D 600-400 MG-UNIT tablet Take 1 tablet by mouth 2 (two) times a day.    [provider]  dorzolamide-timolol (COSOPT) 22.3-6.8 MG/ML ophthalmic solution Place 1 drop into both eyes 2 (two) times a day.    [provider]  hydrALAZINE (APRESOLINE) 50 MG tablet Take 1 tablet (50 mg total) by  mouth 4 (four) times daily. 07/03/19   Earleen Newport, MD  HYDROcodone-acetaminophen (NORCO/VICODIN) 5-325 MG tablet Take 1 tablet by mouth every 6 (six) hours as needed for pain. 05/28/19   [provider]  isosorbide mononitrate (IMDUR) 60 MG 24 hr tablet Take 60 mg by mouth daily.    [provider]  niacin 500 MG tablet Take 500 mg by mouth daily.    [provider]  spironolactone (ALDACTONE) 25 MG tablet Take 1 tablet (25 mg total) by mouth daily. 07/03/19 07/02/20  Earleen Newport, MD    Allergies Patient has no known allergies.    Social History Social History   Tobacco Use  . Smoking status: Former Research scientist (life sciences)  . Smokeless tobacco: Never Used  Substance Use Topics  . Alcohol use: Never    Frequency: Never  . Drug use: Not on file    Review of Systems Patient denies headaches, rhinorrhea, blurry vision, numbness, shortness of breath, chest pain, edema, cough, abdominal pain, nausea, vomiting, diarrhea, dysuria, fevers, rashes or hallucinations unless otherwise stated above in HPI. ____________________________________________   PHYSICAL EXAM:  VITAL SIGNS: Vitals:   08/01/19 1111 08/01/19 1113  BP:  (!) 219/86  Pulse: (!) 59   Resp: 14   Temp:  98.2 F (36.8 C)  SpO2: 99%     Constitutional: Alert and oriented.  Eyes: Conjunctivae are normal.  Head: Atraumatic. Nose: No congestion/rhinnorhea. Mouth/Throat: Mucous membranes are moist.   Neck: No stridor. Painless ROM.  Cardiovascular: Normal rate, regular rhythm. Grossly normal heart sounds.  Good peripheral circulation. Respiratory: Normal respiratory effort.  No retractions. Lungs CTAB. Gastrointestinal: Soft and nontender. No distention. No abdominal bruits. No CVA tenderness. Genitourinary:  Musculoskeletal: No lower extremity tenderness nor edema.  No joint effusions. Neurologic:  Normal speech and language. No gross focal neurologic deficits are appreciated. No facial  droop Skin:  Skin is warm, dry and intact. No rash noted. Psychiatric: Mood and affect are normal. Speech and behavior are normal.  ____________________________________________   LABS (all labs ordered are listed, but only abnormal results are displayed)  Results for orders placed or performed during the hospital encounter of 08/01/19 (from the past 24 hour(s))  Basic metabolic panel     Status: Abnormal   Collection Time: 08/01/19 11:19 AM  Result Value Ref Range   Sodium 120 (L) 135 - 145 mmol/L   Potassium 4.6 3.5 - 5.1 mmol/L   Chloride 90 (L) 98 - 111 mmol/L   CO2 20 (L) 22 - 32 mmol/L   Glucose, Bld 109 (H) 70 - 99 mg/dL   BUN 12 8 - 23 mg/dL   Creatinine, Ser 0.93 0.44 - 1.00 mg/dL   Calcium 9.5 8.9 - 10.3 mg/dL   GFR calc non Af Amer 56 (L) >60 mL/min   GFR calc Af Amer >60 >60 mL/min   Anion gap 10 5 - 15  CBC     Status: Abnormal   Collection Time: 08/01/19 11:19 AM  Result Value Ref Range   WBC 6.7 4.0 - 10.5 K/uL   RBC 3.96 3.87 - 5.11 MIL/uL   Hemoglobin 12.3 12.0 - 15.0 g/dL   HCT 34.1 (L) 36.0 - 46.0 %   MCV 86.1 80.0 - 100.0 fL   MCH 31.1 26.0 - 34.0 pg   MCHC 36.1 (H) 30.0 - 36.0 g/dL   RDW 12.3 11.5 - 15.5 %   Platelets 158 150 - 400 K/uL   nRBC 0.0 0.0 - 0.2 %  SARS Coronavirus 2 [facility] order, Performed in [facility] hospital lab) Nasopharyngeal Nasopharyngeal Swab     Status: None   Collection Time: 08/01/19 11:50 AM   Specimen: Nasopharyngeal Swab  Result Value Ref Range   SARS Coronavirus 2 NEGATIVE NEGATIVE   ____________________________________________  EKG My review and personal interpretation at Time: 11:10   Indication: weakness  Rate: 60  Rhythm: sinus Axis: normal  Other: normal intervals, nos temi ____________________________________________  RADIOLOGY  I personally reviewed all radiographic images ordered to evaluate for the above acute complaints and reviewed radiology reports and findings.  These findings were personally  discussed with the patient.  Please see medical record for radiology report.  ____________________________________________   PROCEDURES  Procedure(s) performed:  .Critical Care Performed by: Merlyn Lot, MD Authorized by: Merlyn Lot, MD   Critical care provider statement:    Critical care time (minutes):  30   Critical care time was exclusive of:  Separately billable procedures and treating other patients   Critical care was necessary to treat or prevent imminent or life-threatening deterioration of the following conditions:  Metabolic crisis   Critical care was time spent personally by me on the following activities:  Development of treatment plan with patient or surrogate, discussions with consultants, evaluation of patient's response to treatment, examination of patient, obtaining history from patient or surrogate, ordering and performing  treatments and interventions, ordering and review of laboratory studies, ordering and review of radiographic studies, pulse oximetry, re-evaluation of patient's condition and review of old [place] performed: yes ____________________________________________   INITIAL IMPRESSION / ASSESSMENT AND PLAN / ED COURSE  Pertinent labs & imaging results that were available during my care of the patient were reviewed by me and considered in my medical decision making (see chart for details).   DDX: Hyponatremia, electrolyte abnormality, dehydration, hypoglycemia, UTI, sepsis pneumonia, hypertension  ALEITA BRUNING is a 85 y.o. who presents to the ED with symptoms as described above with evidence of hypertension as well as acute hyponatremia which explain the patient's symptoms.  Does not seem consistent with CVA.  More likely metabolic.  Suspect this is related to her spironolactone.  Will give IV hydration for correction of her sodium and dehydration.  Will discuss with hospitalist for admission.  Have discussed with the  patient and available family all diagnostics and treatments performed thus far and all questions were answered to the best of my ability. The patient demonstrates understanding and agreement with plan.      The patient was evaluated in Emergency Department today for the symptoms described in the history of present illness. He/she was evaluated in the context of the global COVID-19 pandemic, which necessitated consideration that the patient might be at risk for infection with the SARS-CoV-2 virus that causes COVID-19. Institutional protocols and algorithms that pertain to the evaluation of patients at risk for COVID-19 are in a state of rapid change based on information released by regulatory bodies including the CDC and federal and state organizations. These policies and algorithms were followed during the patient's care in the ED.  As part of my medical decision making, I reviewed the following data within the [place] notes reviewed and incorporated, Labs reviewed, notes from prior ED visits and [place] Controlled Substance Database   ____________________________________________   FINAL CLINICAL IMPRESSION(S) / ED DIAGNOSES  Final diagnoses:  Acute hyponatremia  Weakness      NEW MEDICATIONS STARTED DURING THIS VISIT:  New Prescriptions   No medications on file     Note:  This document was prepared using Dragon voice recognition software and may include unintentional dictation errors.    Merlyn Lot, MD 08/01/19 1324

## 2019-08-01 NOTE — ED Triage Notes (Signed)
PT arrives via ems from home with complaints of increased weakness for the last year (per pt report). Pt states she is "trying to get in with my doctor but I am just sick of waiting and am just getting more tired." Pt denies pain and arrives a& o x 4. Pt HTN for ems but reports she did not take her bp medications today.

## 2019-08-01 NOTE — Progress Notes (Signed)
Family Meeting Note  Advance Directive:yes  Today a meeting took place with the Patient daughter at bedside   The following clinical team members were present during this meeting:MD  The following were discussed:Patient's diagnosis: Severe hyponatremia, generalized weakness, decreased appetite.  Hypertension, hyperlipidemia persistent nausea history of GERD will be admitted to the hospital.  Patient verbalized understanding of the plan.  , Patient's progosis: Unable to determine and Goals for treatment: DNR  Daughter Gilberto Better daughter is the healthcare power of attorney  Additional follow-up to be provided: Hospitalist, nephrology  Time spent during discussion: 17 minutes         hospitalist MD

## 2019-08-01 NOTE — ED Notes (Signed)
Floor unable to take report at this time.

## 2019-08-01 NOTE — H&P (Signed)
[facility] [place] NAME: Kelsey Martin    MR#:  [id number]  DATE OF BIRTH:  1930-09-04  DATE OF ADMISSION:  08/01/2019  PRIMARY CARE PHYSICIAN: Idelle Crouch, MD   REQUESTING/REFERRING PHYSICIAN: Quentin Cornwall  CHIEF COMPLAINT:   Generalized weakness decreased appetite, nausea HISTORY OF PRESENT ILLNESS:  Kelsey Martin  is a 85 y.o. female with medical problems hypertension, hyperlipidemia and GERD and gout is brought into the ED by her daughter as patient has persistent nausea decreased p.o. intake.  Sodium is at 120 in the hospital.  COVID test is negative.  Blood pressure is high and according to the daughter systolic blood pressure runs between 182- 219 at her baseline for the past several months.  CT head is negative here.  Hospitalist team is called admit the patient.  Patient is reporting generalized weakness associated with decreased appetite.  Daughter at bedside  PAST MEDICAL HISTORY:   Past Medical History:  Diagnosis Date  . Abdominal hernia   . Hypertension   Hyperlipidemia GERD  PAST SURGICAL HISTOIRY:   Past Surgical History:  Procedure Laterality Date  . EXPLORATORY LAPAROTOMY      SOCIAL HISTORY:   Social History   Tobacco Use  . Smoking status: Former Research scientist (life sciences)  . Smokeless tobacco: Never Used  Substance Use Topics  . Alcohol use: Never    Frequency: Never    FAMILY HISTORY:   Family History  Problem Relation Age of Onset  . Breast cancer Neg Hx     DRUG ALLERGIES:   Allergies  Allergen Reactions  . Prozac [Fluoxetine] Other (See Comments)    Severe lethargy - seems to apply to all antidepressants   . Amlodipine Swelling    REVIEW OF SYSTEMS:  CONSTITUTIONAL: No fever, fatigue.  Endorses weakness.  EYES: No blurred or double vision.  EARS, NOSE, AND THROAT: No tinnitus or ear pain.  RESPIRATORY: No cough, shortness of breath, wheezing or hemoptysis.  CARDIOVASCULAR: No chest pain,  orthopnea, edema.  GASTROINTESTINAL: No nausea, vomiting, diarrhea or abdominal pain.  GENITOURINARY: No dysuria, hematuria.  ENDOCRINE: No polyuria, nocturia,  HEMATOLOGY: No anemia, easy bruising or bleeding SKIN: No rash or lesion. MUSCULOSKELETAL: No joint pain or arthritis.   NEUROLOGIC: No tingling, numbness, weakness.  PSYCHIATRY: No anxiety or depression.   MEDICATIONS AT HOME:   Prior to Admission medications   Medication Sig Start Date End Date Taking? Authorizing Provider  ALPRAZolam Duanne Moron) 0.5 MG tablet Take 0.5 mg by mouth at bedtime as needed for sleep. 04/19/19  Yes [provider]  aspirin EC 81 MG tablet Take 81 mg by mouth daily.   Yes [provider]  atorvastatin (LIPITOR) 20 MG tablet Take 20 mg by mouth daily. 03/14/19  Yes [provider]  benazepril (LOTENSIN) 20 MG tablet Take 20 mg by mouth 2 (two) times a day. 03/19/19  Yes [provider]  bimatoprost (LUMIGAN) 0.01 % SOLN Place 1 drop into both eyes at bedtime.   Yes [provider]  brimonidine (ALPHAGAN) 0.2 % ophthalmic solution Place 1 drop into the left eye at bedtime.  03/15/19  Yes [provider]  dorzolamide-timolol (COSOPT) 22.3-6.8 MG/ML ophthalmic solution Place 1 drop into both eyes 2 (two) times a day.   Yes [provider]  hydrALAZINE (APRESOLINE) 50 MG tablet Take 1 tablet (50 mg total) by mouth 4 (four) times daily. 07/03/19  Yes Earleen Newport, MD  HYDROcodone-acetaminophen (NORCO/VICODIN) 5-325 MG tablet  Take 1 tablet by mouth every 6 (six) hours as needed for pain. 05/28/19  Yes [provider]  isosorbide mononitrate (IMDUR) 60 MG 24 hr tablet Take 60 mg by mouth daily.   Yes [provider]  niacin 500 MG tablet Take 500 mg by mouth daily.   Yes [provider]  ondansetron (ZOFRAN-ODT) 4 MG disintegrating tablet Take 4 mg by mouth every 8 (eight) hours as needed for nausea or vomiting.   Yes [provider]  spironolactone (ALDACTONE) 25 MG tablet Take 1 tablet (25 mg total) by mouth daily. 07/03/19 07/02/20 Yes Earleen Newport, MD      VITAL SIGNS:  Blood pressure (!) 167/69, pulse 63, temperature 98.2 F (36.8 C), temperature source Oral, resp. rate 17, height 5\' 6"  (1.676 m), weight 68 kg, SpO2 97 %.  PHYSICAL EXAMINATION:  GENERAL:  85 y.o.-year-old patient lying in the bed with no acute distress.  EYES: Pupils equal, round, reactive to light and accommodation. No scleral icterus. Extraocular muscles intact.  HEENT: Head atraumatic, normocephalic. Oropharynx and nasopharynx clear.  NECK:  Supple, no jugular venous distention. No thyroid enlargement, no tenderness.  LUNGS: Normal breath sounds bilaterally, no wheezing, rales,rhonchi or crepitation. No use of accessory muscles of respiration.  CARDIOVASCULAR: S1, S2 normal. No murmurs, rubs, or gallops.  ABDOMEN: Soft, nontender, nondistended.  Some abdominal discomfort is present bowel sounds present.  EXTREMITIES: No pedal edema, cyanosis, or clubbing.  NEUROLOGIC: Cranial nerves II through XII are intact. Muscle strength global weakness in all extremities. Sensation intact. Gait not checked.  PSYCHIATRIC: The patient is alert and oriented x 2-3.  SKIN: No obvious rash, lesion, or ulcer.   LABORATORY PANEL:   CBC Recent Labs  Lab 08/01/19 1119  WBC 6.7  HGB 12.3  HCT 34.1*  PLT 158   ------------------------------------------------------------------------------------------------------------------  Chemistries  Recent Labs  Lab 08/01/19 1119  NA 120*  K 4.6  CL 90*  CO2 20*  GLUCOSE 109*  BUN 12  CREATININE 0.93  CALCIUM 9.5   ------------------------------------------------------------------------------------------------------------------  Cardiac Enzymes No results for input(s): TROPONINI in the last 168  hours. ------------------------------------------------------------------------------------------------------------------  RADIOLOGY:  Dg Chest Portable 1 View  Result Date: 08/01/2019 CLINICAL DATA:  Evaluate for congestive heart failure. EXAM: PORTABLE CHEST 1 VIEW COMPARISON:  None. FINDINGS: The mediastinal contour and cardiac silhouette are normal. There is a hiatal hernia. There is no focal infiltrate, pulmonary edema, or pleural effusion. No acute abnormality is identified in the visualized bony structures. IMPRESSION: No evidence of congestive heart failure.  The lungs are clear. Hiatal hernia. Electronically Signed   By: Abelardo Diesel M.D.   On: 08/01/2019 13:16    EKG:   Orders placed or performed during the hospital encounter of 08/01/19  . EKG 12-Lead  . EKG 12-Lead  . ED EKG  . ED EKG  . EKG 12-Lead  . EKG 12-Lead  . EKG 12-Lead  . EKG 12-Lead    IMPRESSION AND PLAN:    #Generalized weakness secondary to  Hyponatremia-from dehydration, nausea and diuretic adverse effect Admit to MedSurg unit monitor patient on telemetry Serial sodium checks Random urine sodium, urine osmolality and serum muscularity Renal ultrasound Nephrology consult placed and Dr. Zollie Scale is aware Gentle hydration with IV fluids normal saline at 65 mL/h CT head negative, COVID negative  #Hypertensive urgency Continue home medication hydralazine 50 mg every 6 hours and metoprolol is added to the regimen will titrate medications as needed CT head is negative  #Persistent  nausea Antinausea medication and IV hydration Pepcid  #Failure to thrive with a decreased appetite Dietitian consult, calorie count  #GERD Pepcid  Hyperlipidemia statin    All the records are reviewed and case discussed with ED provider. Management plans discussed with the patient, daughter  and they are in agreement.  CODE STATUS: dnr   TOTAL TIME TAKING CARE OF THIS PATIENT: 50  minutes.   Note: This dictation  was prepared with Dragon dictation along with smaller phrase technology. Any transcriptional errors that result from this process are unintentional.  Nicholes Mango M.D on 08/01/2019 at 3:09 PM  Between 7am to 6pm - Pager - [phone]  After 6pm go to [email or web address] - password [email or web address] [facility]  Office  [phone]  CC: Primary care physician; Idelle Crouch, MD

## 2019-08-01 NOTE — ED Notes (Signed)
ED TO INPATIENT HANDOFF REPORT  ED Nurse Name and Phone #: Dionisio David Name/Age/Gender Kelsey Martin 85 y.o. female Room/Bed: ED09A/ED09A  Code Status   Code Status: Not on file  Home/SNF/Other Home Patient oriented to: self, place, time and situation Is this baseline? Yes   Triage Complete: Triage complete  Chief Complaint weakness  Triage Note PT arrives via ems from home with complaints of increased weakness for the last year (per pt report). Pt states she is "trying to get in with my doctor but I am just sick of waiting and am just getting more tired." Pt denies pain and arrives a& o x 4. Pt HTN for ems but reports she did not take her bp medications today.   Allergies No Known Allergies  Level of Care/Admitting Diagnosis ED Disposition    ED Disposition Condition Comment   Admit  The patient appears reasonably stabilized for admission considering the current resources, flow, and capabilities available in the ED at this time, and I doubt any other [facility] requiring further screening and/or treatment in the ED prior to admission is  present.       B Medical/Surgery History Past Medical History:  Diagnosis Date  . Abdominal hernia   . Hypertension    Past Surgical History:  Procedure Laterality Date  . EXPLORATORY LAPAROTOMY       A IV Location/Drains/Wounds Patient Lines/Drains/Airways Status   Active Line/Drains/Airways    Name:   Placement date:   Placement time:   Site:   Days:   Peripheral IV 08/01/19 Right Wrist   08/01/19    1119    Wrist   less than 1          Intake/Output Last 24 hours No intake or output data in the 24 hours ending 08/01/19 1402  Labs/Imaging Results for orders placed or performed during the hospital encounter of 08/01/19 (from the past 48 hour(s))  Basic metabolic panel     Status: Abnormal   Collection Time: 08/01/19 11:19 AM  Result Value Ref Range   Sodium 120 (L) 135 - 145 mmol/L   Potassium 4.6 3.5 - 5.1 mmol/L    Chloride 90 (L) 98 - 111 mmol/L   CO2 20 (L) 22 - 32 mmol/L   Glucose, Bld 109 (H) 70 - 99 mg/dL   BUN 12 8 - 23 mg/dL   Creatinine, Ser 0.93 0.44 - 1.00 mg/dL   Calcium 9.5 8.9 - 10.3 mg/dL   GFR calc non Af Amer 56 (L) >60 mL/min   GFR calc Af Amer >60 >60 mL/min   Anion gap 10 5 - 15    Comment: Performed at [facility], [address] [place], [place] [address]  CBC     Status: Abnormal   Collection Time: 08/01/19 11:19 AM  Result Value Ref Range   WBC 6.7 4.0 - 10.5 K/uL   RBC 3.96 3.87 - 5.11 MIL/uL   Hemoglobin 12.3 12.0 - 15.0 g/dL   HCT 34.1 (L) 36.0 - 46.0 %   MCV 86.1 80.0 - 100.0 fL   MCH 31.1 26.0 - 34.0 pg   MCHC 36.1 (H) 30.0 - 36.0 g/dL   RDW 12.3 11.5 - 15.5 %   Platelets 158 150 - 400 K/uL   nRBC 0.0 0.0 - 0.2 %    Comment: Performed at [facility], [address] [place], [place] [address]  SARS Coronavirus 2 [facility] order, Performed in [facility] hospital lab) Nasopharyngeal Nasopharyngeal Swab  Status: None   Collection Time: 08/01/19 11:50 AM   Specimen: Nasopharyngeal Swab  Result Value Ref Range   SARS Coronavirus 2 NEGATIVE NEGATIVE    Comment: (NOTE) If result is NEGATIVE SARS-CoV-2 target nucleic acids are NOT DETECTED. The SARS-CoV-2 RNA is generally detectable in upper and lower  respiratory specimens during the acute phase of infection. The lowest  concentration of SARS-CoV-2 viral copies this assay can detect is 250  copies / mL. A negative result does not preclude SARS-CoV-2 infection  and should not be used as the sole basis for treatment or other  patient management decisions.  A negative result may occur with  improper specimen collection / handling, submission of specimen other  than nasopharyngeal swab, presence of viral mutation(s) within the  areas targeted by this assay, and inadequate number of viral copies  (<250 copies / mL). A negative result must be combined with clinical  observations, patient  history, and epidemiological information. If result is POSITIVE SARS-CoV-2 target nucleic acids are DETECTED. The SARS-CoV-2 RNA is generally detectable in upper and lower  respiratory specimens dur ing the acute phase of infection.  Positive  results are indicative of active infection with SARS-CoV-2.  Clinical  correlation with patient history and other diagnostic information is  necessary to determine patient infection status.  Positive results do  not rule out bacterial infection or co-infection with other viruses. If result is PRESUMPTIVE POSTIVE SARS-CoV-2 nucleic acids MAY BE PRESENT.   A presumptive positive result was obtained on the submitted specimen  and confirmed on repeat testing.  While 2019 novel coronavirus  (SARS-CoV-2) nucleic acids may be present in the submitted sample  additional confirmatory testing may be necessary for epidemiological  and / or clinical management purposes  to differentiate between  SARS-CoV-2 and other Sarbecovirus currently known to infect humans.  If clinically indicated additional testing with an alternate test  methodology [phone]) is advised. The SARS-CoV-2 RNA is generally  detectable in upper and lower respiratory sp ecimens during the acute  phase of infection. The expected result is Negative. Fact Sheet for Patients:  StrictlyIdeas.no Fact Sheet for Healthcare Providers: BankingDealers.co.za This test is not yet approved or cleared by the [place] FDA and has been authorized for detection and/or diagnosis of SARS-CoV-2 by FDA under an Emergency Use Authorization (EUA).  This EUA will remain in effect (meaning this test can be used) for the duration of the COVID-19 declaration under Section 564(b)(1) of the Act, 21 U.S.C. section 360bbb-3(b)(1), unless the authorization is terminated or revoked sooner. Performed at [facility], [address], [place], [place]  [address]    Dg Chest Portable 1 View  Result Date: 08/01/2019 CLINICAL DATA:  Evaluate for congestive heart failure. EXAM: PORTABLE CHEST 1 VIEW COMPARISON:  None. FINDINGS: The mediastinal contour and cardiac silhouette are normal. There is a hiatal hernia. There is no focal infiltrate, pulmonary edema, or pleural effusion. No acute abnormality is identified in the visualized bony structures. IMPRESSION: No evidence of congestive heart failure.  The lungs are clear. Hiatal hernia. Electronically Signed   By: Abelardo Diesel M.D.   On: 08/01/2019 13:16    Pending Labs Unresulted Labs (From admission, onward)    Start     Ordered   08/01/19 1113  Urinalysis, Complete w Microscopic  ONCE - STAT,   STAT     08/01/19 1113          Vitals/Pain Today's Vitals   08/01/19 1111 08/01/19 1113  BP:  (!) 219/86  Pulse: (!) 59   Resp: 14   Temp:  98.2 F (36.8 C)  TempSrc:  Oral  SpO2: 99%   Weight: 68 kg   Height: 5\' 6"  (1.676 m)   PainSc: 0-No pain     Isolation Precautions No active isolations  Medications Medications  hydrALAZINE (APRESOLINE) tablet 50 mg (50 mg Oral Given 08/01/19 1145)  0.9 %  sodium chloride infusion ( Intravenous New Bag/Given 08/01/19 1150)    Mobility walks with device Low fall risk   Focused Assessments MSC/weakness   R Recommendations: See Admitting Provider Note  Report given to:   Additional Notes:

## 2019-08-02 LAB — COMPREHENSIVE METABOLIC PANEL
ALT: 25 U/L (ref 0–44)
AST: 19 U/L (ref 15–41)
Albumin: 3.2 g/dL — ABNORMAL LOW (ref 3.5–5.0)
Alkaline Phosphatase: 30 U/L — ABNORMAL LOW (ref 38–126)
Anion gap: 10 (ref 5–15)
BUN: 15 mg/dL (ref 8–23)
CO2: 21 mmol/L — ABNORMAL LOW (ref 22–32)
Calcium: 8.9 mg/dL (ref 8.9–10.3)
Chloride: 95 mmol/L — ABNORMAL LOW (ref 98–111)
Creatinine, Ser: 1.12 mg/dL — ABNORMAL HIGH (ref 0.44–1.00)
GFR calc Af Amer: 52 mL/min — ABNORMAL LOW (ref >60)
GFR calc non Af Amer: 44 mL/min — ABNORMAL LOW (ref >60)
Glucose, Bld: 88 mg/dL (ref 70–99)
Potassium: 4.3 mmol/L (ref 3.5–5.1)
Sodium: 126 mmol/L — ABNORMAL LOW (ref 135–145)
Total Bilirubin: 0.5 mg/dL (ref 0.3–1.2)
Total Protein: 5.3 g/dL — ABNORMAL LOW (ref 6.5–8.1)

## 2019-08-02 LAB — CORTISOL: Cortisol, Plasma: 12.4 ug/dL

## 2019-08-02 LAB — CBC
HCT: 30.4 % — ABNORMAL LOW (ref 36.0–46.0)
Hemoglobin: 10.7 g/dL — ABNORMAL LOW (ref 12.0–15.0)
MCH: 31.1 pg (ref 26.0–34.0)
MCHC: 35.2 g/dL (ref 30.0–36.0)
MCV: 88.4 fL (ref 80.0–100.0)
Platelets: 146 10*3/uL — ABNORMAL LOW (ref 150–400)
RBC: 3.44 MIL/uL — ABNORMAL LOW (ref 3.87–5.11)
RDW: 12.6 % (ref 11.5–15.5)
WBC: 6.4 10*3/uL (ref 4.0–10.5)
nRBC: 0 % (ref 0.0–0.2)

## 2019-08-02 LAB — SODIUM: Sodium: 122 mmol/L — ABNORMAL LOW (ref 135–145)

## 2019-08-02 LAB — TSH: TSH: 1.142 u[IU]/mL (ref 0.350–4.500)

## 2019-08-02 MED ORDER — ADULT MULTIVITAMIN W/MINERALS CH
1.0000 | ORAL_TABLET | Freq: Every day | ORAL | Status: DC
  Administered 2019-08-03: 1 via ORAL
  Filled 2019-08-02: qty 1

## 2019-08-02 MED ORDER — SODIUM CHLORIDE 0.9% FLUSH
10.0000 mL | Freq: Two times a day (BID) | INTRAVENOUS | Status: DC
  Administered 2019-08-02: 10 mL via INTRAVENOUS

## 2019-08-02 MED ORDER — ENSURE ENLIVE PO LIQD
237.0000 mL | Freq: Two times a day (BID) | ORAL | Status: DC
  Administered 2019-08-02: 237 mL via ORAL

## 2019-08-02 NOTE — Progress Notes (Signed)
Initial Nutrition Assessment  DOCUMENTATION CODES:   Not applicable  INTERVENTION:   Ensure Enlive po BID, each supplement provides 350 kcal and 20 grams of protein  MVI daily   Liberalize diet   NUTRITION DIAGNOSIS:   Inadequate oral intake related to acute illness as evidenced by per patient/family report.  GOAL:   Patient will meet greater than or equal to 90% of their needs  MONITOR:   PO intake, Supplement acceptance, Labs, Weight trends, Skin, I & O's  REASON FOR ASSESSMENT:   Consult Assessment of nutrition requirement/status  ASSESSMENT:   85 y.o. female with medical problems hypertension, hyperlipidemia and GERD and gout is brought into the ED by her daughter as patient has persistent nausea decreased p.o. intake.  RD working remotely.  Spoke with patient via phone. Pt reports decreased appetite and oral intake pta. Pt is unsure why her appetite is poor but reports she has just not been eating well. Pt does have protein supplements in her refrigerator but reports that she has not been drinking them as she was not sure if they were good for her. Pt reports her appetite is improving in hospital. Pt ate eggs and grits for breakfast today and ate meatloaf, mashed potatoes and cake for lunch. Pt reports eating 75% of her meals but reports that the food has no taste as she is on a heart healthy diet. RD will liberalize diet and add Ensure to help pt meet her estimated needs. Pt unsure if she has had any weight loss. There is not a very detailed weight history in chart to determine if any significant weight loss; there is one documented weight from March of 160lbs.   Medications reviewed and include: aspirin, lovenox, pepcid, niacin, NaCl @65ml /hr  Labs reviewed: Na 126(L), Cl 95(L), creat 1.12(H) Hgb 10.7(L), Hct 30.4(L)  Unable to complete Nutrition-Focused physical exam at this time.   Diet Order:   Diet Order            Diet regular Room service appropriate?  Yes; Fluid consistency: Thin  Diet effective now             EDUCATION NEEDS:   Education needs have been addressed  Skin:  Skin Assessment: Reviewed RN Assessment(ecchymosis)  Last BM:  8/11  Height:   Ht Readings from Last 1 Encounters:  08/01/19 5\' 6"  (1.676 m)    Weight:   Wt Readings from Last 1 Encounters:  08/02/19 67.5 kg    Ideal Body Weight:  59 kg  BMI:  Body mass index is 24.03 kg/m.  Estimated Nutritional Needs:   Kcal:  1400-1600kcal/day  Protein:  70-80g/day  Fluid:  >1.5L/day  Koleen Distance MS, RD, LDN Pager #- [phone] Office#- [phone] After Hours Pager: [phone]

## 2019-08-02 NOTE — Progress Notes (Signed)
[place] at [facility]                                                                                                                                                                                  Patient Demographics   Kelsey Martin, is a 85 y.o. female, DOB - [birth year], [id number]  Admit date - 08/01/2019   Admitting Physician Nicholes Mango, MD  Outpatient Primary MD for the patient is Idelle Crouch, MD   LOS - 1  Subjective: Patient feeling better , no nausea Sodium is improved   Review of Systems:   CONSTITUTIONAL: No documented fever. No fatigue, weakness. No weight gain, no weight loss.  EYES: No blurry or double vision.  ENT: No tinnitus. No postnasal drip. No redness of the oropharynx.  RESPIRATORY: No cough, no wheeze, no hemoptysis. No dyspnea.  CARDIOVASCULAR: No chest pain. No orthopnea. No palpitations. No syncope.  GASTROINTESTINAL: No nausea, no vomiting or diarrhea. No abdominal pain. No melena or hematochezia.  GENITOURINARY: No dysuria or hematuria.  ENDOCRINE: No polyuria or nocturia. No heat or cold intolerance.  HEMATOLOGY: No anemia. No bruising. No bleeding.  INTEGUMENTARY: No rashes. No lesions.  MUSCULOSKELETAL: No arthritis. No swelling. No gout.  NEUROLOGIC: No numbness, tingling, or ataxia. No seizure-type activity.  PSYCHIATRIC: No anxiety. No insomnia. No ADD.    Vitals:   Vitals:   08/02/19 0330 08/02/19 0332 08/02/19 0333 08/02/19 0740  BP: (!) 87/42 (!) 87/42 95/60 (!) 160/73  Pulse: 63 61 63 (!) 59  Resp: 18 18  17   Temp: 98.4 F (36.9 C) 98.4 F (36.9 C)  98.1 F (36.7 C)  TempSrc: Oral Oral  Oral  SpO2: 97% 98% 96% 98%  Weight:   67.5 kg   Height:        Wt Readings from Last 3 Encounters:  08/02/19 67.5 kg  07/03/19 68 kg  02/27/18 72.6 kg     Intake/Output Summary (Last 24 hours) at 08/02/2019 1536 Last data filed at 08/02/2019 0700 Gross per 24 hour  Intake 859.26 ml  Output 450 ml   Net 409.26 ml    Physical Exam:   GENERAL: Pleasant-appearing in no apparent distress.  HEAD, EYES, EARS, NOSE AND THROAT: Atraumatic, normocephalic. Extraocular muscles are intact. Pupils equal and reactive to light. Sclerae anicteric. No conjunctival injection. No oro-pharyngeal erythema.  NECK: Supple. There is no jugular venous distention. No bruits, no lymphadenopathy, no thyromegaly.  HEART: Regular rate and rhythm,. No murmurs, no rubs, no clicks.  LUNGS: Clear to auscultation bilaterally. No rales or rhonchi. No wheezes.  ABDOMEN: Soft, flat, nontender, nondistended. Has good bowel sounds. No hepatosplenomegaly appreciated.  EXTREMITIES: No evidence of any cyanosis, clubbing, or peripheral edema.  +2 pedal and radial pulses bilaterally.  NEUROLOGIC: The patient is alert, awake, and oriented x3 with no focal motor or sensory deficits appreciated bilaterally.  SKIN: Moist and warm with no rashes appreciated.  Psych: Not anxious, depressed LN: No inguinal LN enlargement    Antibiotics   Anti-infectives (From admission, onward)   None      Medications   Scheduled Meds: . aspirin EC  81 mg Oral Daily  . atorvastatin  20 mg Oral Daily  . benazepril  20 mg Oral Daily  . brimonidine  1 drop Left Eye QHS  . dorzolamide-timolol  1 drop Both Eyes BID  . enoxaparin (LOVENOX) injection  40 mg Subcutaneous Q24H  . famotidine  20 mg Oral BID  . feeding supplement (ENSURE ENLIVE)  237 mL Oral BID BM  . hydrALAZINE  50 mg Oral QID  . isosorbide mononitrate  60 mg Oral Daily  . latanoprost  1 drop Both Eyes QHS  . metoprolol tartrate  25 mg Oral BID  . [START ON 08/03/2019] multivitamin with minerals  1 tablet Oral Daily  . niacin  500 mg Oral Daily   Continuous Infusions: PRN Meds:.acetaminophen **OR** acetaminophen, ALPRAZolam, hydrALAZINE, HYDROcodone-acetaminophen, ondansetron   Data Review:   Micro Results Recent Results (from the past 240 hour(s))  SARS Coronavirus 2  [facility] order, Performed in [facility] hospital lab) Nasopharyngeal Nasopharyngeal Swab     Status: None   Collection Time: 08/01/19 11:50 AM   Specimen: Nasopharyngeal Swab  Result Value Ref Range Status   SARS Coronavirus 2 NEGATIVE NEGATIVE Final    Comment: (NOTE) If result is NEGATIVE SARS-CoV-2 target nucleic acids are NOT DETECTED. The SARS-CoV-2 RNA is generally detectable in upper and lower  respiratory specimens during the acute phase of infection. The lowest  concentration of SARS-CoV-2 viral copies this assay can detect is 250  copies / mL. A negative result does not preclude SARS-CoV-2 infection  and should not be used as the sole basis for treatment or other  patient management decisions.  A negative result may occur with  improper specimen collection / handling, submission of specimen other  than nasopharyngeal swab, presence of viral mutation(s) within the  areas targeted by this assay, and inadequate number of viral copies  (<250 copies / mL). A negative result must be combined with clinical  observations, patient history, and epidemiological information. If result is POSITIVE SARS-CoV-2 target nucleic acids are DETECTED. The SARS-CoV-2 RNA is generally detectable in upper and lower  respiratory specimens dur ing the acute phase of infection.  Positive  results are indicative of active infection with SARS-CoV-2.  Clinical  correlation with patient history and other diagnostic information is  necessary to determine patient infection status.  Positive results do  not rule out bacterial infection or co-infection with other viruses. If result is PRESUMPTIVE POSTIVE SARS-CoV-2 nucleic acids MAY BE PRESENT.   A presumptive positive result was obtained on the submitted specimen  and confirmed on repeat testing.  While 2019 novel coronavirus  (SARS-CoV-2) nucleic acids may be present in the submitted sample  additional confirmatory testing may be necessary for  epidemiological  and / or clinical management purposes  to differentiate between  SARS-CoV-2 and other Sarbecovirus currently known to infect humans.  If clinically indicated additional testing with an alternate test  methodology [phone]) is advised. The SARS-CoV-2 RNA is generally  detectable in upper and lower respiratory sp ecimens during  the acute  phase of infection. The expected result is Negative. Fact Sheet for Patients:  StrictlyIdeas.no Fact Sheet for Healthcare Providers: BankingDealers.co.za This test is not yet approved or cleared by the [place] FDA and has been authorized for detection and/or diagnosis of SARS-CoV-2 by FDA under an Emergency Use Authorization (EUA).  This EUA will remain in effect (meaning this test can be used) for the duration of the COVID-19 declaration under Section 564(b)(1) of the Act, 21 U.S.C. section 360bbb-3(b)(1), unless the authorization is terminated or revoked sooner. Performed at [facility], [address], [place], [place] [address]     Radiology Reports US Renal  Result Date: 08/01/2019 CLINICAL DATA:  Hyponatremia EXAM: RENAL / URINARY TRACT ULTRASOUND COMPLETE COMPARISON:  None. FINDINGS: Right Kidney: Renal measurements: 8.7 x 4.1 x 3.9 cm = volume: 60 mL. There is no hydronephrosis. There is near symmetric cortical thinning, not unexpected in a patient of this age. There are no echogenic shadowing kidney stones Left Kidney: Renal measurements: 10.1 x 4.8 x 4 cm = volume: 102 mL. There is no hydronephrosis. There is near symmetric cortical thinning, not unexpected in a patient of this age. There are no echogenic shadowing kidney stones Bladder: Both ureteral jets were visualized. IMPRESSION: 1. No acute sonographic abnormality detected. 2. Mild cortical thinning of both kidneys bilaterally without evidence for hydronephrosis. 3. Both ureteral jets were visualized.  Electronically Signed   By: Constance Holster M.D.   On: 08/01/2019 17:30   Dg Chest Portable 1 View  Result Date: 08/01/2019 CLINICAL DATA:  Evaluate for congestive heart failure. EXAM: PORTABLE CHEST 1 VIEW COMPARISON:  None. FINDINGS: The mediastinal contour and cardiac silhouette are normal. There is a hiatal hernia. There is no focal infiltrate, pulmonary edema, or pleural effusion. No acute abnormality is identified in the visualized bony structures. IMPRESSION: No evidence of congestive heart failure.  The lungs are clear. Hiatal hernia. Electronically Signed   By: Abelardo Diesel M.D.   On: 08/01/2019 13:16     CBC Recent Labs  Lab 08/01/19 1119 08/02/19 0551  WBC 6.7 6.4  HGB 12.3 10.7*  HCT 34.1* 30.4*  PLT 158 146*  MCV 86.1 88.4  MCH 31.1 31.1  MCHC 36.1* 35.2  RDW 12.3 12.6    Chemistries  Recent Labs  Lab 08/01/19 1119 08/01/19 1813 08/02/19 0012 08/02/19 0551  NA 120* 122* 122* 126*  K 4.6  --   --  4.3  CL 90*  --   --  95*  CO2 20*  --   --  21*  GLUCOSE 109*  --   --  88  BUN 12  --   --  15  CREATININE 0.93  --   --  1.12*  CALCIUM 9.5  --   --  8.9  AST  --   --   --  19  ALT  --   --   --  25  ALKPHOS  --   --   --  30*  BILITOT  --   --   --  0.5   ------------------------------------------------------------------------------------------------------------------ estimated creatinine clearance is 33.8 mL/min (A) (by C-G formula based on SCr of 1.12 mg/dL (H)). ------------------------------------------------------------------------------------------------------------------ No results for input(s): HGBA1C in the last 72 hours. ------------------------------------------------------------------------------------------------------------------ No results for input(s): CHOL, HDL, LDLCALC, TRIG, CHOLHDL, LDLDIRECT in the last 72 hours. ------------------------------------------------------------------------------------------------------------------ Recent  Labs    08/02/19 0551  TSH 1.142   ------------------------------------------------------------------------------------------------------------------ No results for input(s): VITAMINB12, FOLATE, FERRITIN, TIBC,  IRON, RETICCTPCT in the last 72 hours.  Coagulation profile No results for input(s): INR, PROTIME in the last 168 hours.  No results for input(s): DDIMER in the last 72 hours.  Cardiac Enzymes No results for input(s): CKMB, TROPONINI, MYOGLOBIN in the last 168 hours.  Invalid input(s): CK ------------------------------------------------------------------------------------------------------------------ Invalid input(s): [place]   #Generalized weakness secondary to  Hyponatremia-from dehydration, nausea and diuretic adverse effect Improved PT recommends home health   #Hypertensive urgency Blood pressure is now improved  #Hyponatremia sodium is improved continue IV fluids Check a cortisol level  #Persistent nausea Improved   #Failure to thrive with a decreased appetite Dietitian consult, calorie count  #GERD Pepcid  #Hyperlipidemia statin  #Chronic kidney disease stage IV      Code Status Orders  (From admission, onward)         Start     Ordered   08/01/19 1602  Do not attempt resuscitation (DNR)  Continuous    Question Answer Comment  In the event of cardiac or respiratory ARREST Do not call a "code blue"   In the event of cardiac or respiratory ARREST Do not perform Intubation, CPR, defibrillation or ACLS   In the event of cardiac or respiratory ARREST Use medication by any route, position, wound care, and other measures to relive pain and suffering. May use oxygen, suction and manual treatment of airway obstruction as needed for comfort.   Comments RN may pronounce      08/01/19 1601        Code Status History    Date Active Date Inactive Code Status Order ID Comments User Context   08/01/2019 1519 08/01/2019 1601 DNR  [id number]  Nicholes Mango, MD ED   Advance Care Planning Activity    Advance Directive Documentation     Most Recent Value  Type of Advance Directive  Healthcare Power of Attorney  Pre-existing out of facility DNR order (yellow form or pink MOST form)  -  "MOST" Form in Place?  -           Consults none  DVT Prophylaxis  Lovenox   Lab Results  Component Value Date   PLT 146 (L) 08/02/2019     Time Spent in minutes  20min  Greater than 50% of time spent in care coordination and counseling patient regarding the condition and plan of care.   Dustin Flock M.D on 08/02/2019 at 3:36 PM  Between 7am to 6pm - Pager - [phone]  After 6pm go to [email or web address] - Proofreader  [employer]   Office  [phone]

## 2019-08-02 NOTE — Plan of Care (Signed)
  Problem: Health Behavior/Discharge Planning: Goal: Ability to manage health-related needs will improve Outcome: Progressing   Problem: Clinical Measurements: Goal: Respiratory complications will improve Outcome: Progressing Goal: Cardiovascular complication will be avoided Outcome: Progressing   Problem: Activity: Goal: Risk for activity intolerance will decrease Outcome: Progressing   

## 2019-08-02 NOTE — Evaluation (Signed)
Physical Therapy Evaluation Patient Details Name: Kelsey Martin MRN: [id number] DOB: December 15, 1930 Today's Date: 08/02/2019   History of Present Illness  Pt admitted for hyponatremia with complaints of weakness. History includes HTN, hyperlipidemia, GERD, and gout.   Clinical Impression  Pt is a pleasant 85 year old female who was admitted for hyponatremia. Pt performs bed mobility with independence, transfers with mod I, and ambulation with cga and no AD. Pt active at baseline and has good family support at home. Pt does not require any further PT needs at this time. Pt will be dc in house and does not require follow up. RN aware. Will dc current orders.     Follow Up Recommendations No PT follow up    Equipment Recommendations  None recommended by PT    Recommendations for Other Services       Precautions / Restrictions Precautions Precautions: Fall Restrictions Weight Bearing Restrictions: No      Mobility  Bed Mobility Overal bed mobility: Independent             General bed mobility comments: safe technique performed with ease of transfer to EOB  Transfers Overall transfer level: Modified independent Equipment used: None Transfers: Sit to/from Stand Sit to Stand: Modified independent (Device/Increase time)         General transfer comment: pushed from seated surface. Once standing, upright posture performed  Ambulation/Gait Ambulation/Gait assistance: Min guard Gait Distance (Feet): 220 Feet Assistive device: None Gait Pattern/deviations: Step-through pattern     General Gait Details: small BOS with short step length on R LE. Per patient, she reports she has leg length discrepency. Decreased arm swing noted. Able to carry conversation during ambulation without fatigue. No LOB noted  Stairs            Wheelchair Mobility    Modified Rankin (Stroke Patients Only)       Balance Overall balance assessment: Independent                                            Pertinent Vitals/Pain Pain Assessment: No/denies pain    Home Living Family/patient expects to be discharged to:: Private residence Living Arrangements: Children(daughter ) Available Help at Discharge: Family;Available 24 hours/day Type of Home: House Home Access: Stairs to enter Entrance Stairs-Rails: Can reach both Entrance Stairs-Number of Steps: 3 Home Layout: One level Home Equipment: None      Prior Function Level of Independence: Independent         Comments: active, reports she climbs over her fence daily to get to her newspaper. No falls     Hand Dominance        Extremity/Trunk Assessment   Upper Extremity Assessment Upper Extremity Assessment: Overall WFL for tasks assessed    Lower Extremity Assessment Lower Extremity Assessment: Generalized weakness(B LE grossly 4/5)       Communication   Communication: No difficulties  Cognition Arousal/Alertness: Awake/alert Behavior During Therapy: WFL for tasks assessed/performed Overall Cognitive Status: Within Functional Limits for tasks assessed                                        General Comments      Exercises Other Exercises Other Exercises: supine ther-ex performed on B LE including AP, ankle circles, SLRs, and  heel slides. All ther-ex performed x 10 reps with supervision. Safe technique performed   Assessment/Plan    PT Assessment Patent does not need any further PT services  PT Problem List         PT Treatment Interventions      PT Goals (Current goals can be found in the Care Plan section)  Acute Rehab PT Goals Patient Stated Goal: to go home PT Goal Formulation: All assessment and education complete, DC therapy Time For Goal Achievement: 08/02/19 Potential to Achieve Goals: Good    Frequency     Barriers to discharge        Co-evaluation               AM-PAC PT "6 Clicks" Mobility  Outcome Measure Help needed  turning from your back to your side while in a flat bed without using bedrails?: None Help needed moving from lying on your back to sitting on the side of a flat bed without using bedrails?: None Help needed moving to and from a bed to a chair (including a wheelchair)?: None Help needed standing up from a chair using your arms (e.g., wheelchair or bedside chair)?: None Help needed to walk in hospital room?: None Help needed climbing 3-5 steps with a railing? : None 6 Click Score: 24    End of Session Equipment Utilized During Treatment: Gait belt Activity Tolerance: Patient tolerated treatment well Patient left: in chair;with chair alarm set Nurse Communication: Mobility status PT Visit Diagnosis: Muscle weakness (generalized) (M62.81)    Time: [id number] PT Time Calculation (min) (ACUTE ONLY): 20 min   Charges:   PT Evaluation $PT Eval Low Complexity: 1 Low PT Treatments $Therapeutic Exercise: 8-22 mins        Greggory Stallion, PT, DPT [phone]   Lisa Blakeman 08/02/2019, 1:02 PM

## 2019-08-03 LAB — BASIC METABOLIC PANEL
Anion gap: 7 (ref 5–15)
BUN: 16 mg/dL (ref 8–23)
CO2: 22 mmol/L (ref 22–32)
Calcium: 9.3 mg/dL (ref 8.9–10.3)
Chloride: 97 mmol/L — ABNORMAL LOW (ref 98–111)
Creatinine, Ser: 1.06 mg/dL — ABNORMAL HIGH (ref 0.44–1.00)
GFR calc Af Amer: 55 mL/min — ABNORMAL LOW (ref >60)
GFR calc non Af Amer: 48 mL/min — ABNORMAL LOW (ref >60)
Glucose, Bld: 109 mg/dL — ABNORMAL HIGH (ref 70–99)
Potassium: 4.4 mmol/L (ref 3.5–5.1)
Sodium: 126 mmol/L — ABNORMAL LOW (ref 135–145)

## 2019-08-03 MED ORDER — TOLVAPTAN 15 MG PO TABS
15.0000 mg | ORAL_TABLET | Freq: Once | ORAL | Status: AC
  Administered 2019-08-03: 15 mg via ORAL
  Filled 2019-08-03: qty 1

## 2019-08-03 NOTE — Plan of Care (Signed)

## 2019-08-03 NOTE — Progress Notes (Addendum)
Medication tolvaptan (SAMSCA) tablet 15 mg given. Medication information given. Patient and daughter in room verbalized understanding. Discharge instructions gone over with daughter at bedside. Printed information on tolvaptan also given. IV taken out. Patient discharged with follow up appointment made.

## 2019-08-03 NOTE — Care Management Important Message (Signed)
Important Message  Patient Details  Name: Kelsey Martin MRN: [id number] Date of Birth: 06/06/1930   Medicare Important Message Given:  Yes  Initial Medicare IM given by Patient Access Associate on 08/02/2019 at 10:25am.  Still valid.    Dannette Barbara 08/03/2019, 8:51 AM

## 2019-08-03 NOTE — Discharge Summary (Signed)
[facility] [place] [facility], 85 y.o., DOB 1930-02-26, MRN [id number]. Admission date: 08/01/2019 Discharge Date 08/03/2019 Primary MD Idelle Crouch, MD Admitting Physician Nicholes Mango, MD  Admission Diagnosis  Acute hyponatremia [E87.1] Hyponatremia [E87.1] Weakness [R53.1]  Discharge Diagnosis   Active Problems:   Hyponatremia Generalized weakness Depression hypertensive urgency Nausea GERD Hyperlipidemia Chronic kidney disease stage IV   Hospital Course  Kelsey Martin  is a 85 y.o. female with medical problems hypertension, hyperlipidemia and GERD and gout is brought into the ED by her daughter as patient has persistent nausea decreased p.o. intake.    Patient came to ER and was noted to have hyponatremia and was given IV fluids with improvement in his sodium.  But is still low.  I had to give her a trial of Aptiom.  Patient very anxious to go home.  So I am discharging the patient but will need a sodium check back on Monday.  Also recommended spironolactone needed to be held.             Consults  None  Significant Tests:  See full reports for all details     US Renal  Result Date: 08/01/2019 CLINICAL DATA:  Hyponatremia EXAM: RENAL / URINARY TRACT ULTRASOUND COMPLETE COMPARISON:  None. FINDINGS: Right Kidney: Renal measurements: 8.7 x 4.1 x 3.9 cm = volume: 60 mL. There is no hydronephrosis. There is near symmetric cortical thinning, not unexpected in a patient of this age. There are no echogenic shadowing kidney stones Left Kidney: Renal measurements: 10.1 x 4.8 x 4 cm = volume: 102 mL. There is no hydronephrosis. There is near symmetric cortical thinning, not unexpected in a patient of this age. There are no echogenic shadowing kidney stones Bladder: Both ureteral jets were visualized. IMPRESSION: 1. No acute sonographic abnormality detected. 2. Mild cortical thinning of both kidneys bilaterally without evidence for  hydronephrosis. 3. Both ureteral jets were visualized. Electronically Signed   By: Constance Holster M.D.   On: 08/01/2019 17:30   Dg Chest Portable 1 View  Result Date: 08/01/2019 CLINICAL DATA:  Evaluate for congestive heart failure. EXAM: PORTABLE CHEST 1 VIEW COMPARISON:  None. FINDINGS: The mediastinal contour and cardiac silhouette are normal. There is a hiatal hernia. There is no focal infiltrate, pulmonary edema, or pleural effusion. No acute abnormality is identified in the visualized bony structures. IMPRESSION: No evidence of congestive heart failure.  The lungs are clear. Hiatal hernia. Electronically Signed   By: Abelardo Diesel M.D.   On: 08/01/2019 13:16       Today   Subjective:   Kelsey Martin patient feeling well very anxious to go home  Objective:   Blood pressure (!) 178/75, pulse 61, temperature 98.2 F (36.8 C), temperature source Oral, resp. rate 16, height 5\' 6"  (1.676 m), weight 67.4 kg, SpO2 100 %.  .  Intake/Output Summary (Last 24 hours) at 08/03/2019 1252 Last data filed at 08/03/2019 0213 Gross per 24 hour  Intake -  Output 800 ml  Net -800 ml    Exam VITAL SIGNS: Blood pressure (!) 178/75, pulse 61, temperature 98.2 F (36.8 C), temperature source Oral, resp. rate 16, height 5\' 6"  (1.676 m), weight 67.4 kg, SpO2 100 %.  GENERAL:  85 y.o.-year-old patient lying in the bed with no acute distress.  EYES: Pupils equal, round, reactive to light and accommodation. No scleral icterus. Extraocular muscles intact.  HEENT: Head atraumatic, normocephalic. Oropharynx and nasopharynx clear.  NECK:  Supple, no jugular venous distention. No thyroid enlargement, no tenderness.  LUNGS: Normal breath sounds bilaterally, no wheezing, rales,rhonchi or crepitation. No use of accessory muscles of respiration.  CARDIOVASCULAR: S1, S2 normal. No murmurs, rubs, or gallops.  ABDOMEN: Soft, nontender, nondistended. Bowel sounds present. No organomegaly or mass.  EXTREMITIES: No  pedal edema, cyanosis, or clubbing.  NEUROLOGIC: Cranial nerves II through XII are intact. Muscle strength 5/5 in all extremities. Sensation intact. Gait not checked.  PSYCHIATRIC: The patient is alert and oriented x 3.  SKIN: No obvious rash, lesion, or ulcer.   Data Review     CBC w Diff:  Lab Results  Component Value Date   WBC 6.4 08/02/2019   HGB 10.7 (L) 08/02/2019   HGB 11.9 (L) 08/10/2014   HCT 30.4 (L) 08/02/2019   HCT 35.2 08/10/2014   PLT 146 (L) 08/02/2019   PLT 202 08/10/2014   LYMPHOPCT 24 07/03/2019   LYMPHOPCT 28.8 08/10/2014   MONOPCT 10 07/03/2019   MONOPCT 11.9 08/10/2014   EOSPCT 2 07/03/2019   EOSPCT 1.8 08/10/2014   BASOPCT 1 07/03/2019   BASOPCT 0.8 08/10/2014   CMP:  Lab Results  Component Value Date   NA 126 (L) 08/03/2019   NA 130 (L) 08/10/2014   K 4.4 08/03/2019   K 3.5 08/10/2014   CL 97 (L) 08/03/2019   CL 97 (L) 08/10/2014   CO2 22 08/03/2019   CO2 22 08/10/2014   BUN 16 08/03/2019   BUN 23 (H) 08/10/2014   CREATININE 1.06 (H) 08/03/2019   CREATININE 1.18 08/10/2014   PROT 5.3 (L) 08/02/2019   ALBUMIN 3.2 (L) 08/02/2019   BILITOT 0.5 08/02/2019   ALKPHOS 30 (L) 08/02/2019   AST 19 08/02/2019   ALT 25 08/02/2019  .  Micro Results Recent Results (from the past 240 hour(s))  SARS Coronavirus 2 [facility] order, Performed in [facility] hospital lab) Nasopharyngeal Nasopharyngeal Swab     Status: None   Collection Time: 08/01/19 11:50 AM   Specimen: Nasopharyngeal Swab  Result Value Ref Range Status   SARS Coronavirus 2 NEGATIVE NEGATIVE Final    Comment: (NOTE) If result is NEGATIVE SARS-CoV-2 target nucleic acids are NOT DETECTED. The SARS-CoV-2 RNA is generally detectable in upper and lower  respiratory specimens during the acute phase of infection. The lowest  concentration of SARS-CoV-2 viral copies this assay can detect is 250  copies / mL. A negative result does not preclude SARS-CoV-2 infection  and should not be used  as the sole basis for treatment or other  patient management decisions.  A negative result may occur with  improper specimen collection / handling, submission of specimen other  than nasopharyngeal swab, presence of viral mutation(s) within the  areas targeted by this assay, and inadequate number of viral copies  (<250 copies / mL). A negative result must be combined with clinical  observations, patient history, and epidemiological information. If result is POSITIVE SARS-CoV-2 target nucleic acids are DETECTED. The SARS-CoV-2 RNA is generally detectable in upper and lower  respiratory specimens dur ing the acute phase of infection.  Positive  results are indicative of active infection with SARS-CoV-2.  Clinical  correlation with patient history and other diagnostic information is  necessary to determine patient infection status.  Positive results do  not rule out bacterial infection or co-infection with other viruses. If result is PRESUMPTIVE POSTIVE SARS-CoV-2 nucleic acids MAY BE PRESENT.   A presumptive positive result was obtained on the submitted specimen  and confirmed on repeat testing.  While 2019 novel coronavirus  (SARS-CoV-2) nucleic acids may be present in the submitted sample  additional confirmatory testing may be necessary for epidemiological  and / or clinical management purposes  to differentiate between  SARS-CoV-2 and other Sarbecovirus currently known to infect humans.  If clinically indicated additional testing with an alternate test  methodology [phone]) is advised. The SARS-CoV-2 RNA is generally  detectable in upper and lower respiratory sp ecimens during the acute  phase of infection. The expected result is Negative. Fact Sheet for Patients:  StrictlyIdeas.no Fact Sheet for Healthcare Providers: BankingDealers.co.za This test is not yet approved or cleared by the [place] FDA and has been authorized for  detection and/or diagnosis of SARS-CoV-2 by FDA under an Emergency Use Authorization (EUA).  This EUA will remain in effect (meaning this test can be used) for the duration of the COVID-19 declaration under Section 564(b)(1) of the Act, 21 U.S.C. section 360bbb-3(b)(1), unless the authorization is terminated or revoked sooner. Performed at [facility], [address] [place], [place] [address]         Code Status Orders  (From admission, onward)         Start     Ordered   08/01/19 1602  Do not attempt resuscitation (DNR)  Continuous    Question Answer Comment  In the event of cardiac or respiratory ARREST Do not call a "code blue"   In the event of cardiac or respiratory ARREST Do not perform Intubation, CPR, defibrillation or ACLS   In the event of cardiac or respiratory ARREST Use medication by any route, position, wound care, and other measures to relive pain and suffering. May use oxygen, suction and manual treatment of airway obstruction as needed for comfort.   Comments RN may pronounce      08/01/19 1601        Code Status History    Date Active Date Inactive Code Status Order ID Comments User Context   08/01/2019 1519 08/01/2019 1601 DNR [id number]  Nicholes Mango, MD ED   Advance Care Planning Activity    Advance Directive Documentation     Most Recent Value  Type of Advance Directive  Healthcare Power of [place]  Pre-existing out of facility DNR order (yellow form or pink MOST form)  -  "MOST" Form in Place?  -          Follow-up Information    Idelle Crouch, MD. Go on 08/09/2019.   Specialty: Internal Medicine Why: needs bmp check on monday to follow na level .Marland KitchenMarland KitchenMarland KitchenMarland Kitchenappointment at 10:30am Contact information: [place] [address] [phone]           Discharge Medications   Allergies as of 08/03/2019      Reactions   Prozac [fluoxetine] Other (See Comments)   Severe lethargy - seems to apply to all  antidepressants    Amlodipine Swelling      Medication List    STOP taking these medications   spironolactone 25 MG tablet Commonly known as: Aldactone     TAKE these medications   ALPRAZolam 0.5 MG tablet Commonly known as: XANAX Take 0.5 mg by mouth at bedtime as needed for sleep.   aspirin EC 81 MG tablet Take 81 mg by mouth daily.   atorvastatin 20 MG tablet Commonly known as: LIPITOR Take 20 mg by mouth daily.   benazepril 20 MG tablet Commonly known as: LOTENSIN Take 20 mg by mouth  2 (two) times a day.   brimonidine 0.2 % ophthalmic solution Commonly known as: ALPHAGAN Place 1 drop into the left eye at bedtime.   dorzolamide-timolol 22.3-6.8 MG/ML ophthalmic solution Commonly known as: COSOPT Place 1 drop into both eyes 2 (two) times a day.   hydrALAZINE 50 MG tablet Commonly known as: APRESOLINE Take 1 tablet (50 mg total) by mouth 4 (four) times daily.   HYDROcodone-acetaminophen 5-325 MG tablet Commonly known as: NORCO/VICODIN Take 1 tablet by mouth every 6 (six) hours as needed for pain.   isosorbide mononitrate 60 MG 24 hr tablet Commonly known as: IMDUR Take 60 mg by mouth daily.   Lumigan 0.01 % Soln Generic drug: bimatoprost Place 1 drop into both eyes at bedtime.   niacin 500 MG tablet Take 500 mg by mouth daily.   ondansetron 4 MG disintegrating tablet Commonly known as: ZOFRAN-ODT Take 4 mg by mouth every 8 (eight) hours as needed for nausea or vomiting.          Total Time in preparing paper work, data evaluation and todays exam - 47 minutes  Dustin Flock M.D on 08/03/2019 at 12:52 PM [place]  [phone]

## 2021-02-07 IMAGING — CT CT HEAD WITHOUT CONTRAST
3 series · 14 of 47 positions shown, 16 images · non-contrast
Comparison: April 23, 2019

CLINICAL DATA: High blood pressure today.  Focal neural deficit.

EXAM:
CT HEAD WITHOUT CONTRAST
TECHNIQUE: Contiguous axial images were obtained from the base of the skull
through the vertex without intravenous contrast.

[Series 2: head wo · axial · 0.47mm/px · z∈[-120,+5]mm · 8 of 31 slices shown, 10 images]
[im 3/31  brain]
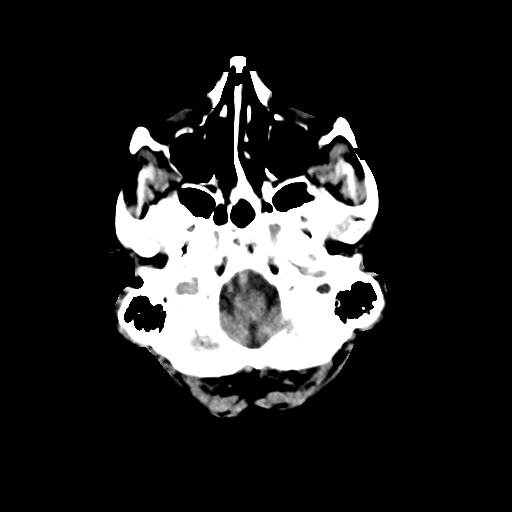
[im 3/31  bone]
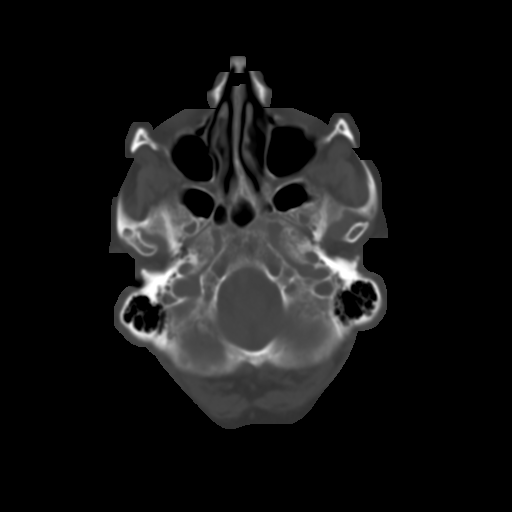
[im 7/31  brain]
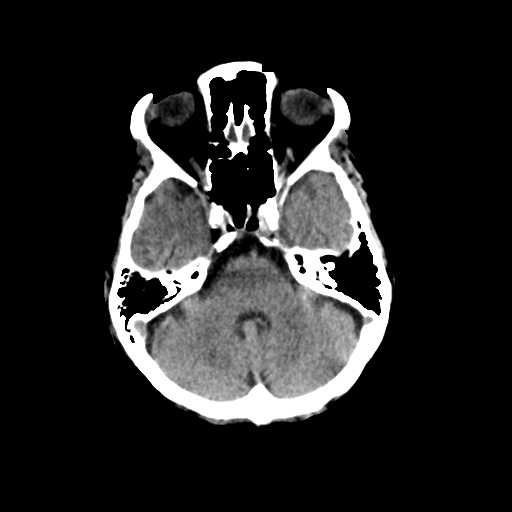
[im 10/31  brain]
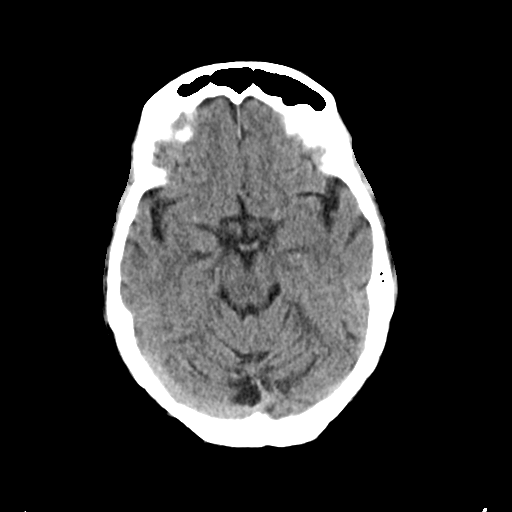
[im 14/31  brain]
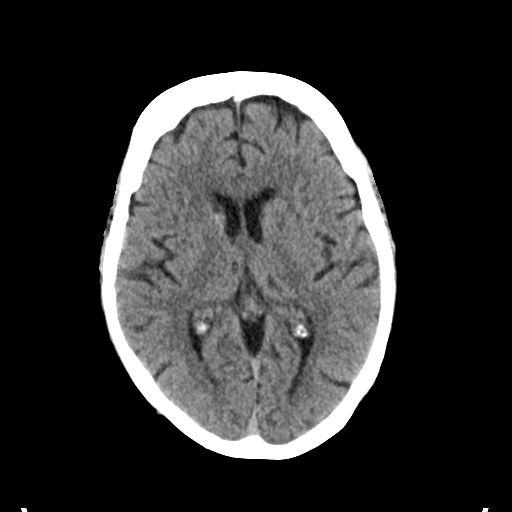
[im 17/31  brain]
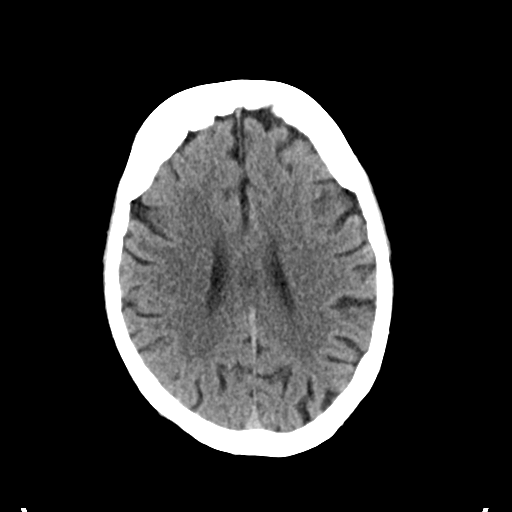
[im 17/31  bone]
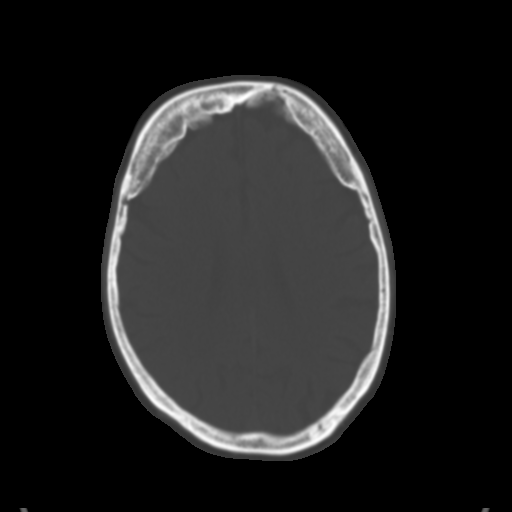
[im 21/31  brain]
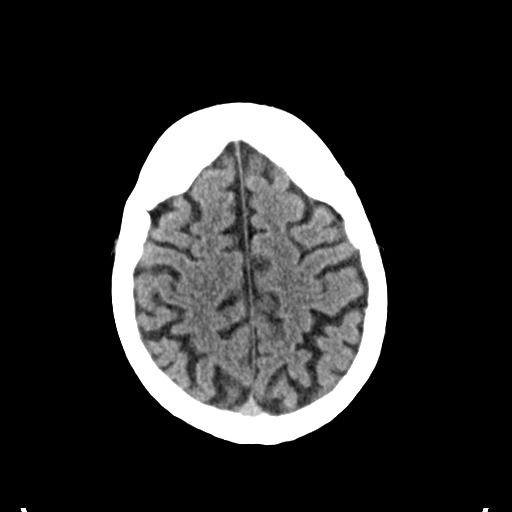
[im 24/31  brain]
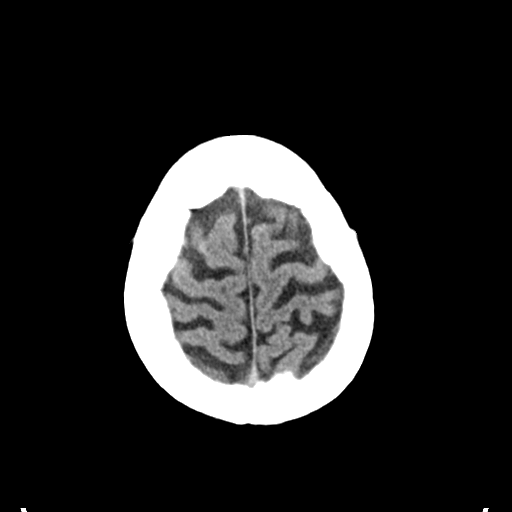
[im 28/31  brain]
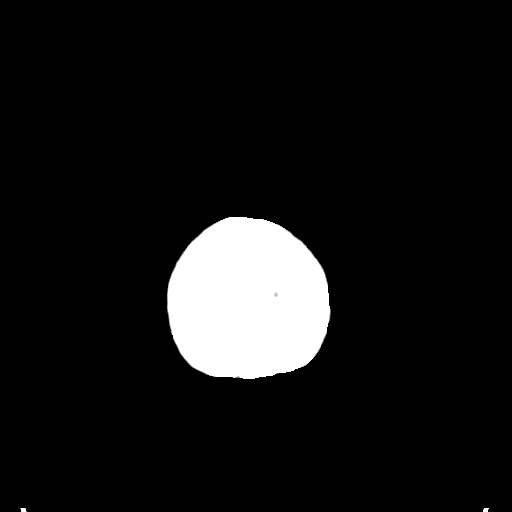

[Series 4: coronal soft tissue · coronal · 0.32mm/px · 3 of 61 slices shown]
[im 21/61  brain]
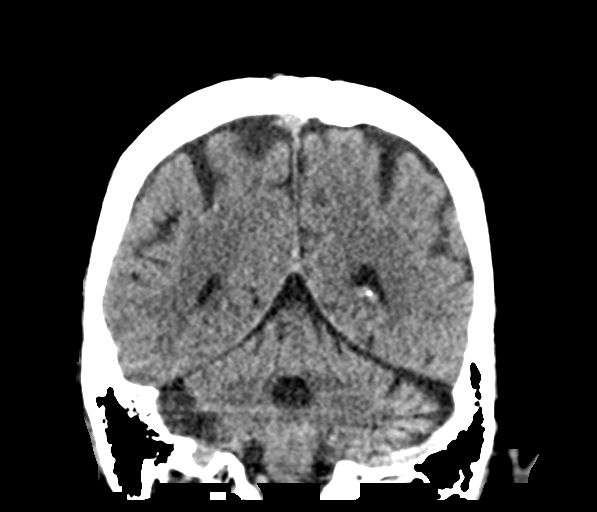
[im 27/61  brain]
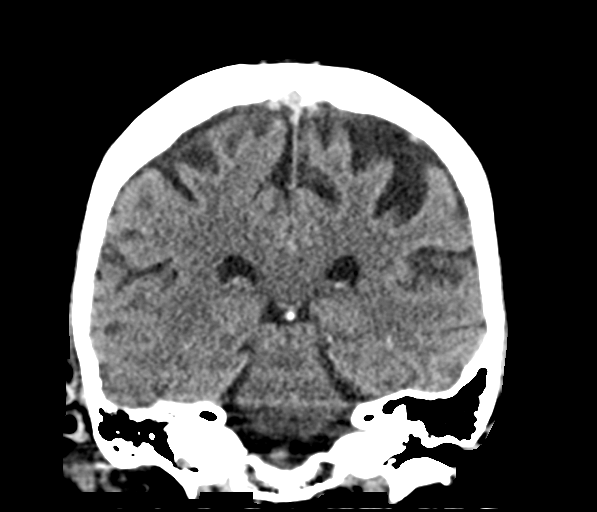
[im 34/61  brain]
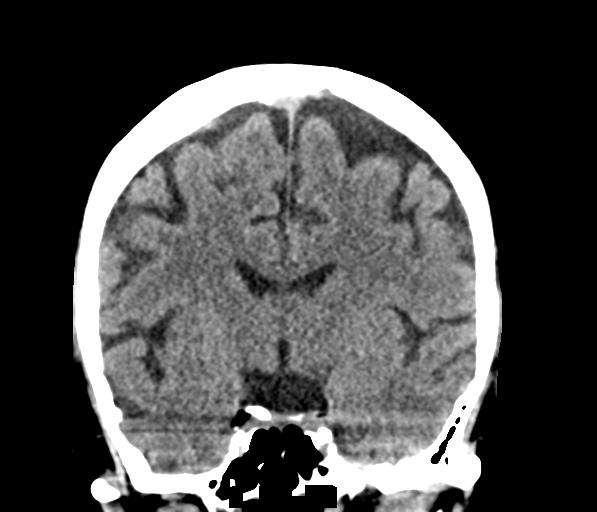

[Series 5: sagittal soft tissue · sagittal · 0.29mm/px · 3 of 51 slices shown]
[im 17/51  brain]
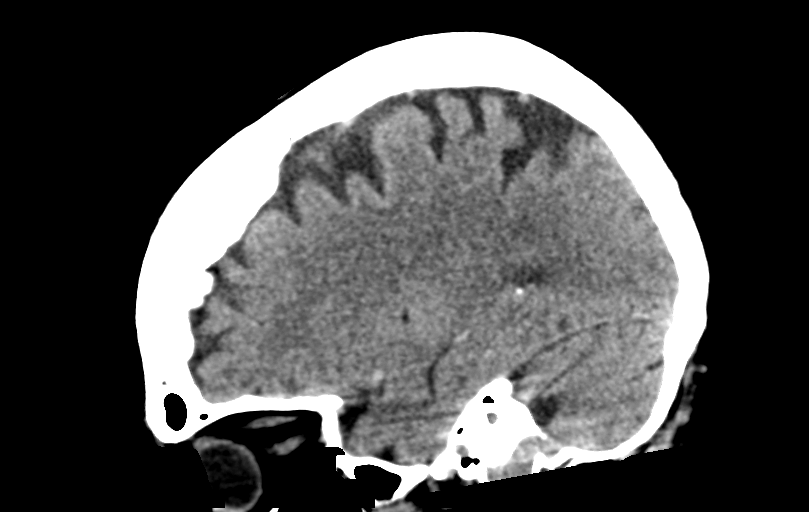
[im 26/51  brain]
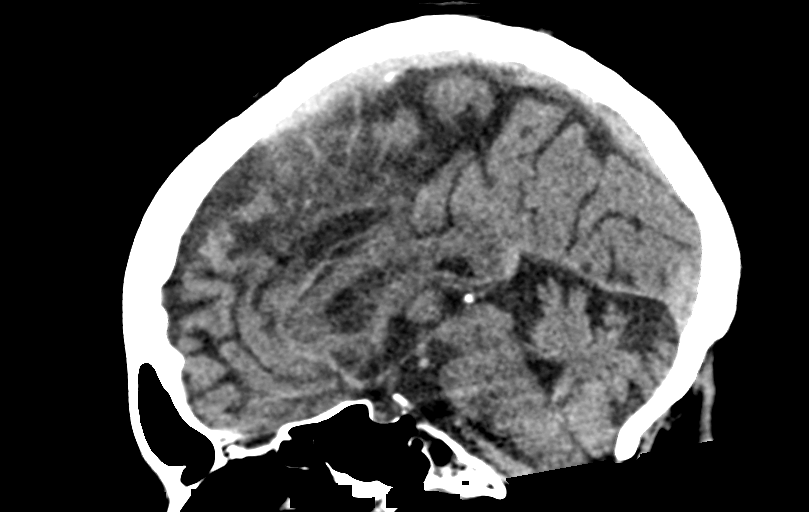
[im 34/51  brain]
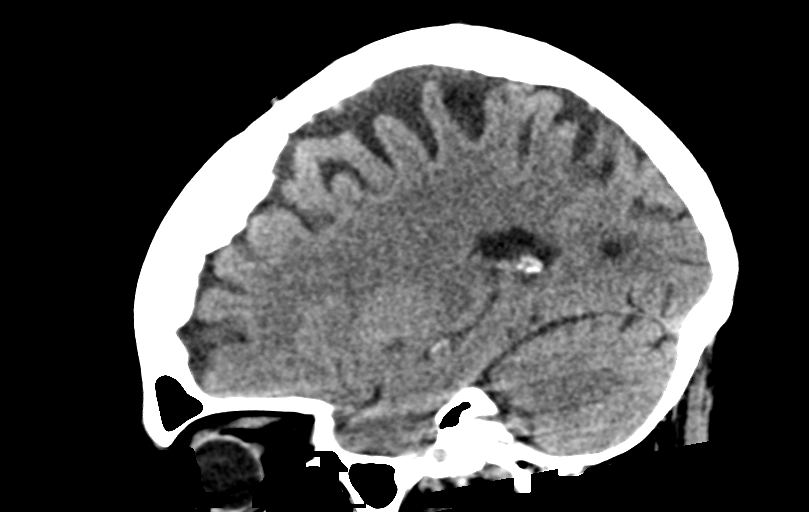

[14 of 47 positions shown; findings below may reference images not displayed]

FINDINGS: Brain: No evidence of acute infarction, hemorrhage, hydrocephalus,
extra-axial collection or mass lesion/mass effect. Small old
infarcts are identified in bilateral thalamus and right basal
ganglia. There is chronic diffuse atrophy. Chronic bilateral
periventricular white matter small vessel ischemic changes are
noted.

Vascular: No hyperdense vessel is noted.

Skull: Normal. Negative for fracture or focal lesion.

Sinuses/Orbits: No acute finding.

Other: None.
IMPRESSION: No focal acute intracranial abnormality identified.

Small old infarcts in bilateral thalamus and right basal ganglia.
Chronic diffuse atrophy. Chronic bilateral periventricular white
matter small vessel ischemic change.

## 2023-07-19 ENCOUNTER — Inpatient Hospital Stay: Payer: Medicare Other

## 2023-07-19 ENCOUNTER — Other Ambulatory Visit: Payer: Self-pay

## 2023-07-19 ENCOUNTER — Inpatient Hospital Stay
Admit: 2023-07-19 | Discharge: 2023-07-19 | Disposition: A | Discharge status: Home / Self Care | Payer: Medicare Other | Attending: Cardiology | Admitting: Cardiology

## 2023-07-19 ENCOUNTER — Emergency Department: Payer: Medicare Other

## 2023-07-19 ENCOUNTER — Encounter
Admission: EM | Disposition: A | Discharge status: Home Health | Payer: Self-pay | Source: Home / Self Care | Attending: Internal Medicine

## 2023-07-19 ENCOUNTER — Inpatient Hospital Stay
Admission: EM | Admit: 2023-07-19 | Discharge: 2023-07-24 | DRG: 522 | Disposition: A | Discharge status: Home Health | Payer: Medicare Other | Attending: Internal Medicine | Admitting: Internal Medicine

## 2023-07-19 ENCOUNTER — Inpatient Hospital Stay: Payer: Medicare Other | Admitting: Certified Registered"

## 2023-07-19 DIAGNOSIS — I110 Hypertensive heart disease with heart failure: Secondary | ICD-10-CM | POA: Diagnosis present

## 2023-07-19 DIAGNOSIS — Z87891 Personal history of nicotine dependence: Secondary | ICD-10-CM | POA: Diagnosis not present

## 2023-07-19 DIAGNOSIS — Z66 Do not resuscitate: Secondary | ICD-10-CM | POA: Diagnosis present

## 2023-07-19 DIAGNOSIS — I160 Hypertensive urgency: Secondary | ICD-10-CM | POA: Diagnosis present

## 2023-07-19 DIAGNOSIS — D62 Acute posthemorrhagic anemia: Secondary | ICD-10-CM | POA: Diagnosis not present

## 2023-07-19 DIAGNOSIS — Y92008 Other place in unspecified non-institutional (private) residence as the place of occurrence of the external cause: Secondary | ICD-10-CM | POA: Diagnosis not present

## 2023-07-19 DIAGNOSIS — E440 Moderate protein-calorie malnutrition: Secondary | ICD-10-CM | POA: Diagnosis present

## 2023-07-19 DIAGNOSIS — Z7901 Long term (current) use of anticoagulants: Secondary | ICD-10-CM | POA: Diagnosis not present

## 2023-07-19 DIAGNOSIS — I4891 Unspecified atrial fibrillation: Secondary | ICD-10-CM | POA: Diagnosis not present

## 2023-07-19 DIAGNOSIS — W010XXA Fall on same level from slipping, tripping and stumbling without subsequent striking against object, initial encounter: Secondary | ICD-10-CM | POA: Diagnosis present

## 2023-07-19 DIAGNOSIS — D72828 Other elevated white blood cell count: Secondary | ICD-10-CM | POA: Diagnosis present

## 2023-07-19 DIAGNOSIS — Z8249 Family history of ischemic heart disease and other diseases of the circulatory system: Secondary | ICD-10-CM

## 2023-07-19 DIAGNOSIS — I480 Paroxysmal atrial fibrillation: Secondary | ICD-10-CM | POA: Diagnosis present

## 2023-07-19 DIAGNOSIS — R5381 Other malaise: Secondary | ICD-10-CM | POA: Diagnosis present

## 2023-07-19 DIAGNOSIS — I2510 Atherosclerotic heart disease of native coronary artery without angina pectoris: Secondary | ICD-10-CM | POA: Diagnosis present

## 2023-07-19 DIAGNOSIS — Z79899 Other long term (current) drug therapy: Secondary | ICD-10-CM | POA: Diagnosis not present

## 2023-07-19 DIAGNOSIS — E871 Hypo-osmolality and hyponatremia: Secondary | ICD-10-CM | POA: Diagnosis present

## 2023-07-19 DIAGNOSIS — Z7982 Long term (current) use of aspirin: Secondary | ICD-10-CM | POA: Diagnosis not present

## 2023-07-19 DIAGNOSIS — E876 Hypokalemia: Secondary | ICD-10-CM | POA: Diagnosis present

## 2023-07-19 DIAGNOSIS — S72142A Displaced intertrochanteric fracture of left femur, initial encounter for closed fracture: Secondary | ICD-10-CM | POA: Diagnosis present

## 2023-07-19 DIAGNOSIS — Y92009 Unspecified place in unspecified non-institutional (private) residence as the place of occurrence of the external cause: Secondary | ICD-10-CM

## 2023-07-19 DIAGNOSIS — Z6821 Body mass index (BMI) 21.0-21.9, adult: Secondary | ICD-10-CM | POA: Diagnosis not present

## 2023-07-19 DIAGNOSIS — I10 Essential (primary) hypertension: Secondary | ICD-10-CM | POA: Diagnosis present

## 2023-07-19 DIAGNOSIS — Z888 Allergy status to other drugs, medicaments and biological substances status: Secondary | ICD-10-CM

## 2023-07-19 DIAGNOSIS — Z8673 Personal history of transient ischemic attack (TIA), and cerebral infarction without residual deficits: Secondary | ICD-10-CM

## 2023-07-19 DIAGNOSIS — W19XXXA Unspecified fall, initial encounter: Secondary | ICD-10-CM | POA: Diagnosis not present

## 2023-07-19 DIAGNOSIS — R197 Diarrhea, unspecified: Secondary | ICD-10-CM | POA: Diagnosis present

## 2023-07-19 DIAGNOSIS — S72002A Fracture of unspecified part of neck of left femur, initial encounter for closed fracture: Principal | ICD-10-CM

## 2023-07-19 DIAGNOSIS — E785 Hyperlipidemia, unspecified: Secondary | ICD-10-CM | POA: Diagnosis present

## 2023-07-19 DIAGNOSIS — R296 Repeated falls: Secondary | ICD-10-CM | POA: Diagnosis present

## 2023-07-19 HISTORY — DX: Atherosclerotic heart disease of native coronary artery without angina pectoris: I25.10

## 2023-07-19 HISTORY — PX: HIP ARTHROPLASTY: SHX981

## 2023-07-19 HISTORY — DX: Anxiety disorder, unspecified: F41.9

## 2023-07-19 HISTORY — DX: Hyperlipidemia, unspecified: E78.5

## 2023-07-19 LAB — ECHOCARDIOGRAM COMPLETE
Height: 65 in
Weight: 2112 oz

## 2023-07-19 LAB — CBC
HCT: 35.1 % — ABNORMAL LOW (ref 36.0–46.0)
Hemoglobin: 12.5 g/dL (ref 12.0–15.0)
MCH: 32.6 pg (ref 26.0–34.0)
MCHC: 35.6 g/dL (ref 30.0–36.0)
MCV: 91.6 fL (ref 80.0–100.0)
Platelets: 240 10*3/uL (ref 150–400)
RBC: 3.83 MIL/uL — ABNORMAL LOW (ref 3.87–5.11)
RDW: 12.6 % (ref 11.5–15.5)
WBC: 10.6 10*3/uL — ABNORMAL HIGH (ref 4.0–10.5)
nRBC: 0 % (ref 0.0–0.2)

## 2023-07-19 LAB — T4, FREE: Free T4: 1.2 ng/dL — ABNORMAL HIGH (ref 0.61–1.12)

## 2023-07-19 LAB — COMPREHENSIVE METABOLIC PANEL
ALT: 16 U/L (ref 0–44)
AST: 25 U/L (ref 15–41)
Albumin: 3.9 g/dL (ref 3.5–5.0)
Alkaline Phosphatase: 49 U/L (ref 38–126)
Anion gap: 11 (ref 5–15)
BUN: 9 mg/dL (ref 8–23)
CO2: 23 mmol/L (ref 22–32)
Calcium: 9.7 mg/dL (ref 8.9–10.3)
Chloride: 93 mmol/L — ABNORMAL LOW (ref 98–111)
Creatinine, Ser: 0.56 mg/dL (ref 0.44–1.00)
GFR, Estimated: >60 mL/min (ref >60)
Glucose, Bld: 115 mg/dL — ABNORMAL HIGH (ref 70–99)
Potassium: 3.5 mmol/L (ref 3.5–5.1)
Sodium: 127 mmol/L — ABNORMAL LOW (ref 135–145)
Total Bilirubin: 1.2 mg/dL (ref 0.3–1.2)
Total Protein: 6.8 g/dL (ref 6.5–8.1)

## 2023-07-19 LAB — BASIC METABOLIC PANEL
Anion gap: 9 (ref 5–15)
BUN: 9 mg/dL (ref 8–23)
CO2: 24 mmol/L (ref 22–32)
Calcium: 9.6 mg/dL (ref 8.9–10.3)
Chloride: 93 mmol/L — ABNORMAL LOW (ref 98–111)
Creatinine, Ser: 0.69 mg/dL (ref 0.44–1.00)
GFR, Estimated: >60 mL/min (ref >60)
Glucose, Bld: 119 mg/dL — ABNORMAL HIGH (ref 70–99)
Potassium: 3.5 mmol/L (ref 3.5–5.1)
Sodium: 126 mmol/L — ABNORMAL LOW (ref 135–145)

## 2023-07-19 LAB — TSH: TSH: 0.899 u[IU]/mL (ref 0.350–4.500)

## 2023-07-19 LAB — PROTIME-INR
INR: 1.1 (ref 0.8–1.2)
Prothrombin Time: 14.6 seconds (ref 11.4–15.2)

## 2023-07-19 LAB — TYPE AND SCREEN
ABO/RH(D): O POS
Antibody Screen: NEGATIVE

## 2023-07-19 LAB — APTT: aPTT: 30 seconds (ref 24–36)

## 2023-07-19 LAB — SURGICAL PCR SCREEN
MRSA, PCR: NEGATIVE
Staphylococcus aureus: NEGATIVE

## 2023-07-19 LAB — CK: Total CK: 43 U/L (ref 38–234)

## 2023-07-19 SURGERY — HEMIARTHROPLASTY, HIP, DIRECT ANTERIOR APPROACH, FOR FRACTURE
Anesthesia: Spinal | Site: Hip | Laterality: Left

## 2023-07-19 MED ORDER — ALPRAZOLAM 0.5 MG PO TABS
0.5000 mg | ORAL_TABLET | Freq: Every evening | ORAL | Status: DC | PRN
  Administered 2023-07-19 – 2023-07-23 (×5): 0.5 mg via ORAL
  Filled 2023-07-19 (×5): qty 1

## 2023-07-19 MED ORDER — DOCUSATE SODIUM 100 MG PO CAPS
100.0000 mg | ORAL_CAPSULE | Freq: Two times a day (BID) | ORAL | Status: DC
  Administered 2023-07-20 – 2023-07-24 (×9): 100 mg via ORAL
  Filled 2023-07-19 (×10): qty 1

## 2023-07-19 MED ORDER — METOPROLOL SUCCINATE ER 25 MG PO TB24: 25.0000 mg | ORAL_TABLET | Freq: Every day | ORAL | Status: DC

## 2023-07-19 MED ORDER — CEFAZOLIN SODIUM-DEXTROSE 2-4 GM/100ML-% IV SOLN
2.0000 g | Freq: Three times a day (TID) | INTRAVENOUS | Status: AC
  Administered 2023-07-19 – 2023-07-20 (×3): 2 g via INTRAVENOUS
  Filled 2023-07-19 (×3): qty 100

## 2023-07-19 MED ORDER — SODIUM CHLORIDE FLUSH 0.9 % IV SOLN
INTRAVENOUS | Status: AC
  Filled 2023-07-19: qty 10

## 2023-07-19 MED ORDER — METOCLOPRAMIDE HCL 5 MG/ML IJ SOLN: 5.0000 mg | Freq: Three times a day (TID) | INTRAMUSCULAR | Status: DC | PRN

## 2023-07-19 MED ORDER — CEFAZOLIN SODIUM-DEXTROSE 2-4 GM/100ML-% IV SOLN
2.0000 g | INTRAVENOUS | Status: AC
  Administered 2023-07-19: 2 g via INTRAVENOUS
  Filled 2023-07-19: qty 100

## 2023-07-19 MED ORDER — TRANEXAMIC ACID 1000 MG/10ML IV SOLN
INTRAVENOUS | Status: AC
  Filled 2023-07-19: qty 10

## 2023-07-19 MED ORDER — ATORVASTATIN CALCIUM 20 MG PO TABS
20.0000 mg | ORAL_TABLET | Freq: Every day | ORAL | Status: DC
  Administered 2023-07-19 – 2023-07-23 (×5): 20 mg via ORAL
  Filled 2023-07-19 (×5): qty 1

## 2023-07-19 MED ORDER — PHENYLEPHRINE HCL-NACL 20-0.9 MG/250ML-% IV SOLN
INTRAVENOUS | Status: DC | PRN
  Administered 2023-07-19: 40 ug/min via INTRAVENOUS
  Administered 2023-07-19 (×2): 80 ug via INTRAVENOUS

## 2023-07-19 MED ORDER — SENNOSIDES-DOCUSATE SODIUM 8.6-50 MG PO TABS
1.0000 | ORAL_TABLET | Freq: Every evening | ORAL | Status: DC | PRN
  Administered 2023-07-22: 1 via ORAL
  Filled 2023-07-19: qty 1

## 2023-07-19 MED ORDER — FENTANYL CITRATE (PF) 100 MCG/2ML IJ SOLN
INTRAMUSCULAR | Status: AC
  Filled 2023-07-19: qty 2

## 2023-07-19 MED ORDER — ACETAMINOPHEN 10 MG/ML IV SOLN
INTRAVENOUS | Status: DC | PRN
  Administered 2023-07-19: 1000 mg via INTRAVENOUS

## 2023-07-19 MED ORDER — HYDRALAZINE HCL 20 MG/ML IJ SOLN
10.0000 mg | INTRAMUSCULAR | Status: DC | PRN
  Administered 2023-07-19: 10 mg via INTRAVENOUS
  Filled 2023-07-19 (×2): qty 1

## 2023-07-19 MED ORDER — SODIUM CHLORIDE 0.9 % IV SOLN
INTRAVENOUS | Status: DC | PRN
  Administered 2023-07-19: 100 mL via TOPICAL

## 2023-07-19 MED ORDER — LACTATED RINGERS IV SOLN: INTRAVENOUS | Status: DC

## 2023-07-19 MED ORDER — SODIUM CHLORIDE 0.9 % IV SOLN: INTRAVENOUS | Status: DC

## 2023-07-19 MED ORDER — FLEET ENEMA 7-19 GM/118ML RE ENEM: 1.0000 | ENEMA | Freq: Once | RECTAL | Status: DC | PRN

## 2023-07-19 MED ORDER — LATANOPROST 0.005 % OP SOLN
1.0000 [drp] | Freq: Every day | OPHTHALMIC | Status: DC
  Administered 2023-07-19 – 2023-07-23 (×5): 1 [drp] via OPHTHALMIC
  Filled 2023-07-19: qty 2.5

## 2023-07-19 MED ORDER — PROPOFOL 500 MG/50ML IV EMUL
INTRAVENOUS | Status: DC | PRN
  Administered 2023-07-19: 25 ug/kg/min via INTRAVENOUS

## 2023-07-19 MED ORDER — MORPHINE SULFATE (PF) 2 MG/ML IV SOLN: 1.0000 mg | INTRAVENOUS | Status: DC | PRN

## 2023-07-19 MED ORDER — EPINEPHRINE PF 1 MG/ML IJ SOLN
INTRAMUSCULAR | Status: AC
  Filled 2023-07-19: qty 1

## 2023-07-19 MED ORDER — DORZOLAMIDE HCL-TIMOLOL MAL 2-0.5 % OP SOLN
1.0000 [drp] | Freq: Two times a day (BID) | OPHTHALMIC | Status: DC
  Administered 2023-07-19 – 2023-07-21 (×5): 1 [drp] via OPHTHALMIC
  Filled 2023-07-19: qty 10

## 2023-07-19 MED ORDER — METOCLOPRAMIDE HCL 5 MG PO TABS: 5.0000 mg | ORAL_TABLET | Freq: Three times a day (TID) | ORAL | Status: DC | PRN

## 2023-07-19 MED ORDER — OXYCODONE HCL 5 MG PO TABS: 5.0000 mg | ORAL_TABLET | Freq: Once | ORAL | Status: DC | PRN

## 2023-07-19 MED ORDER — PHENYLEPHRINE HCL (PRESSORS) 10 MG/ML IV SOLN
INTRAVENOUS | Status: AC
  Filled 2023-07-19: qty 1

## 2023-07-19 MED ORDER — OXYCODONE-ACETAMINOPHEN 5-325 MG PO TABS
1.0000 | ORAL_TABLET | ORAL | Status: DC | PRN
  Administered 2023-07-20 – 2023-07-24 (×12): 1 via ORAL
  Filled 2023-07-19 (×12): qty 1

## 2023-07-19 MED ORDER — BISOPROLOL FUMARATE 5 MG PO TABS: 5.0000 mg | ORAL_TABLET | Freq: Every day | ORAL | Status: DC

## 2023-07-19 MED ORDER — PHENYLEPHRINE HCL-NACL 20-0.9 MG/250ML-% IV SOLN
INTRAVENOUS | Status: AC
  Filled 2023-07-19: qty 250

## 2023-07-19 MED ORDER — ISOSORBIDE MONONITRATE ER 30 MG PO TB24
60.0000 mg | ORAL_TABLET | Freq: Every day | ORAL | Status: DC
  Administered 2023-07-19 – 2023-07-24 (×6): 60 mg via ORAL
  Filled 2023-07-19 (×6): qty 2

## 2023-07-19 MED ORDER — TRANEXAMIC ACID 1000 MG/10ML IV SOLN
INTRAVENOUS | Status: DC | PRN
  Administered 2023-07-19: 1000 mg via TOPICAL

## 2023-07-19 MED ORDER — BISACODYL 10 MG RE SUPP: 10.0000 mg | Freq: Every day | RECTAL | Status: DC | PRN

## 2023-07-19 MED ORDER — BRIMONIDINE TARTRATE 0.2 % OP SOLN
1.0000 [drp] | Freq: Every day | OPHTHALMIC | Status: DC
  Administered 2023-07-19 – 2023-07-21 (×3): 1 [drp] via OPHTHALMIC
  Filled 2023-07-19: qty 5

## 2023-07-19 MED ORDER — BUPIVACAINE-EPINEPHRINE (PF) 0.5% -1:200000 IJ SOLN
INTRAMUSCULAR | Status: AC
  Filled 2023-07-19: qty 30

## 2023-07-19 MED ORDER — BISOPROLOL FUMARATE 5 MG PO TABS
5.0000 mg | ORAL_TABLET | Freq: Once | ORAL | Status: AC
  Administered 2023-07-19: 5 mg via ORAL
  Filled 2023-07-19: qty 1

## 2023-07-19 MED ORDER — SODIUM CHLORIDE 0.9 % IR SOLN
Status: DC | PRN
  Administered 2023-07-19: 3000 mL

## 2023-07-19 MED ORDER — CLONIDINE HCL 0.2 MG/24HR TD PTWK
0.2000 mg | MEDICATED_PATCH | TRANSDERMAL | Status: DC
  Filled 2023-07-19: qty 1

## 2023-07-19 MED ORDER — ONDANSETRON HCL 4 MG PO TABS: 4.0000 mg | ORAL_TABLET | Freq: Four times a day (QID) | ORAL | Status: DC | PRN

## 2023-07-19 MED ORDER — BUPIVACAINE HCL (PF) 0.5 % IJ SOLN
INTRAMUSCULAR | Status: AC
  Filled 2023-07-19: qty 30

## 2023-07-19 MED ORDER — OXYCODONE HCL 5 MG/5ML PO SOLN: 5.0000 mg | Freq: Once | ORAL | Status: DC | PRN

## 2023-07-19 MED ORDER — CLONIDINE HCL 0.1 MG/24HR TD PTWK
0.1000 mg | MEDICATED_PATCH | TRANSDERMAL | Status: DC
  Administered 2023-07-19: 0.1 mg via TRANSDERMAL
  Filled 2023-07-19: qty 1

## 2023-07-19 MED ORDER — ACETAMINOPHEN 325 MG PO TABS
325.0000 mg | ORAL_TABLET | Freq: Four times a day (QID) | ORAL | Status: DC | PRN
  Administered 2023-07-23: 650 mg via ORAL
  Filled 2023-07-19: qty 2

## 2023-07-19 MED ORDER — PROPOFOL 10 MG/ML IV BOLUS
INTRAVENOUS | Status: DC | PRN
  Administered 2023-07-19: 10 mg via INTRAVENOUS

## 2023-07-19 MED ORDER — TRIAMCINOLONE ACETONIDE 40 MG/ML IJ SUSP
INTRAMUSCULAR | Status: DC | PRN
  Administered 2023-07-19: 63 mL via PERIARTICULAR

## 2023-07-19 MED ORDER — FENTANYL CITRATE (PF) 100 MCG/2ML IJ SOLN: 25.0000 ug | INTRAMUSCULAR | Status: DC | PRN

## 2023-07-19 MED ORDER — ACETAMINOPHEN 325 MG PO TABS: 650.0000 mg | ORAL_TABLET | Freq: Four times a day (QID) | ORAL | Status: DC | PRN

## 2023-07-19 MED ORDER — MUPIROCIN 2 % EX OINT: 1.0000 | TOPICAL_OINTMENT | Freq: Two times a day (BID) | CUTANEOUS | Status: DC

## 2023-07-19 MED ORDER — MAGNESIUM HYDROXIDE 400 MG/5ML PO SUSP: 30.0000 mL | Freq: Every day | ORAL | Status: DC | PRN

## 2023-07-19 MED ORDER — 0.9 % SODIUM CHLORIDE (POUR BTL) OPTIME
TOPICAL | Status: DC | PRN
  Administered 2023-07-19: 500 mL

## 2023-07-19 MED ORDER — KETOROLAC TROMETHAMINE 30 MG/ML IJ SOLN
INTRAMUSCULAR | Status: AC
  Filled 2023-07-19: qty 1

## 2023-07-19 MED ORDER — APIXABAN 2.5 MG PO TABS
2.5000 mg | ORAL_TABLET | Freq: Two times a day (BID) | ORAL | Status: DC
  Administered 2023-07-20 – 2023-07-24 (×9): 2.5 mg via ORAL
  Filled 2023-07-19 (×9): qty 1

## 2023-07-19 MED ORDER — BISOPROLOL FUMARATE 5 MG PO TABS
5.0000 mg | ORAL_TABLET | Freq: Every day | ORAL | Status: DC
  Administered 2023-07-20 – 2023-07-24 (×5): 5 mg via ORAL
  Filled 2023-07-19 (×5): qty 1

## 2023-07-19 MED ORDER — TRIAMCINOLONE ACETONIDE 40 MG/ML IJ SUSP
INTRAMUSCULAR | Status: AC
  Filled 2023-07-19: qty 2

## 2023-07-19 MED ORDER — ASPIRIN 81 MG PO TBEC
81.0000 mg | DELAYED_RELEASE_TABLET | Freq: Every day | ORAL | Status: DC
  Administered 2023-07-20 – 2023-07-21 (×2): 81 mg via ORAL
  Filled 2023-07-19 (×2): qty 1

## 2023-07-19 MED ORDER — DIPHENHYDRAMINE HCL 12.5 MG/5ML PO ELIX: 12.5000 mg | ORAL_SOLUTION | ORAL | Status: DC | PRN

## 2023-07-19 MED ORDER — BUPIVACAINE LIPOSOME 1.3 % IJ SUSP
INTRAMUSCULAR | Status: AC
  Filled 2023-07-19: qty 20

## 2023-07-19 MED ORDER — ORAL CARE MOUTH RINSE
15.0000 mL | Freq: Once | OROMUCOSAL | Status: DC
  Filled 2023-07-19: qty 15

## 2023-07-19 MED ORDER — ACETAMINOPHEN 10 MG/ML IV SOLN
INTRAVENOUS | Status: AC
  Filled 2023-07-19: qty 100

## 2023-07-19 MED ORDER — ATORVASTATIN CALCIUM 20 MG PO TABS: 20.0000 mg | ORAL_TABLET | Freq: Every day | ORAL | Status: DC

## 2023-07-19 MED ORDER — ONDANSETRON HCL 4 MG/2ML IJ SOLN: 4.0000 mg | Freq: Three times a day (TID) | INTRAMUSCULAR | Status: DC | PRN

## 2023-07-19 MED ORDER — CEFAZOLIN SODIUM-DEXTROSE 2-4 GM/100ML-% IV SOLN
INTRAVENOUS | Status: AC
  Filled 2023-07-19: qty 100

## 2023-07-19 MED ORDER — BENAZEPRIL HCL 20 MG PO TABS
20.0000 mg | ORAL_TABLET | Freq: Every day | ORAL | Status: DC
  Administered 2023-07-19: 20 mg via ORAL
  Filled 2023-07-19: qty 1

## 2023-07-19 MED ORDER — DROPERIDOL 2.5 MG/ML IJ SOLN: 0.6250 mg | Freq: Once | INTRAMUSCULAR | Status: DC | PRN

## 2023-07-19 MED ORDER — ONDANSETRON HCL 4 MG/2ML IJ SOLN: 4.0000 mg | Freq: Four times a day (QID) | INTRAMUSCULAR | Status: DC | PRN

## 2023-07-19 MED ORDER — PROMETHAZINE HCL 25 MG/ML IJ SOLN: 6.2500 mg | INTRAMUSCULAR | Status: DC | PRN

## 2023-07-19 MED ORDER — LIDOCAINE 5 % EX PTCH
1.0000 | MEDICATED_PATCH | CUTANEOUS | Status: DC
  Administered 2023-07-19: 1 via TRANSDERMAL
  Filled 2023-07-19: qty 1

## 2023-07-19 MED ORDER — ENSURE ENLIVE PO LIQD
237.0000 mL | Freq: Two times a day (BID) | ORAL | Status: DC
  Administered 2023-07-22 – 2023-07-23 (×3): 237 mL via ORAL

## 2023-07-19 MED ORDER — CHLORHEXIDINE GLUCONATE 0.12 % MT SOLN: 15.0000 mL | Freq: Once | OROMUCOSAL | Status: DC

## 2023-07-19 MED ORDER — ACETAMINOPHEN 10 MG/ML IV SOLN: 1000.0000 mg | Freq: Once | INTRAVENOUS | Status: DC | PRN

## 2023-07-19 MED ORDER — ONDANSETRON HCL 4 MG/2ML IJ SOLN
INTRAMUSCULAR | Status: DC | PRN
  Administered 2023-07-19: 4 mg via INTRAVENOUS

## 2023-07-19 MED ORDER — DEXAMETHASONE SODIUM PHOSPHATE 10 MG/ML IJ SOLN
INTRAMUSCULAR | Status: DC | PRN
  Administered 2023-07-19: 5 mg via INTRAVENOUS

## 2023-07-19 MED ORDER — BUPIVACAINE HCL (PF) 0.5 % IJ SOLN
INTRAMUSCULAR | Status: DC | PRN
  Administered 2023-07-19: 2 mL

## 2023-07-19 MED ORDER — METHOCARBAMOL 500 MG PO TABS
500.0000 mg | ORAL_TABLET | Freq: Three times a day (TID) | ORAL | Status: DC | PRN
  Administered 2023-07-23: 500 mg via ORAL
  Filled 2023-07-19: qty 1

## 2023-07-19 MED ORDER — CEFAZOLIN SODIUM-DEXTROSE 2-3 GM-%(50ML) IV SOLR
INTRAVENOUS | Status: DC | PRN
  Administered 2023-07-19: 2 g via INTRAVENOUS

## 2023-07-19 MED ORDER — ADULT MULTIVITAMIN W/MINERALS CH
1.0000 | ORAL_TABLET | Freq: Every day | ORAL | Status: DC
  Administered 2023-07-20 – 2023-07-24 (×5): 1 via ORAL
  Filled 2023-07-19 (×5): qty 1

## 2023-07-19 MED ORDER — HYDRALAZINE HCL 50 MG PO TABS
50.0000 mg | ORAL_TABLET | Freq: Four times a day (QID) | ORAL | Status: DC
  Administered 2023-07-19 – 2023-07-24 (×17): 50 mg via ORAL
  Filled 2023-07-19 (×17): qty 1

## 2023-07-19 SURGICAL SUPPLY — 66 items
APL PRP STRL LF DISP 70% ISPRP (MISCELLANEOUS) ×2
BAG DECANTER FOR FLEXI CONT (MISCELLANEOUS) IMPLANT
BLADE SAGITTAL WIDE XTHICK NO (BLADE) ×1 IMPLANT
BLADE SAW SAG 25.4X90 (BLADE) ×1 IMPLANT
BLADE SURG SZ20 CARB STEEL (BLADE) ×1 IMPLANT
BOWL CEMENT MIXING ADV NOZZLE (MISCELLANEOUS) IMPLANT
CEMENT BONE 40GM (Cement) IMPLANT
CENTRALIZER STM 11XPSTNR STRL (Orthopedic Implant) IMPLANT
CHLORAPREP W/TINT 26 (MISCELLANEOUS) ×2 IMPLANT
CNTRLZR STM 11XPSTNR STRL (Orthopedic Implant) ×1 IMPLANT
DISTAL CENTRALIZER 11MM (Orthopedic Implant) ×1 IMPLANT
DRAPE IMP U-DRAPE 54X76 (DRAPES) ×1 IMPLANT
DRAPE INCISE IOBAN 66X60 STRL (DRAPES) ×1 IMPLANT
DRAPE SURG 17X11 SM STRL (DRAPES) ×1 IMPLANT
DRAPE SURG 17X23 STRL (DRAPES) ×1 IMPLANT
DRSG OPSITE POSTOP 4X12 (GAUZE/BANDAGES/DRESSINGS) IMPLANT
DRSG OPSITE POSTOP 4X8 (GAUZE/BANDAGES/DRESSINGS) IMPLANT
ELECT BLADE 6.5 EXT (BLADE) IMPLANT
ELECT CAUTERY BLADE 6.4 (BLADE) ×1 IMPLANT
ELECT REM PT RETURN 9FT ADLT (ELECTROSURGICAL) ×1
ELECTRODE REM PT RTRN 9FT ADLT (ELECTROSURGICAL) ×1 IMPLANT
GAUZE PACK 2X3YD (PACKING) IMPLANT
GAUZE XEROFORM 1X8 LF (GAUZE/BANDAGES/DRESSINGS) ×1 IMPLANT
GLOVE BIO SURGEON STRL SZ8 (GLOVE) ×3 IMPLANT
GLOVE BIOGEL M STRL SZ7.5 (GLOVE) IMPLANT
GLOVE BIOGEL PI IND STRL 8 (GLOVE) ×1 IMPLANT
GOWN STRL REUS W/ TWL LRG LVL3 (GOWN DISPOSABLE) ×1 IMPLANT
GOWN STRL REUS W/ TWL XL LVL3 (GOWN DISPOSABLE) ×1 IMPLANT
GOWN STRL REUS W/TWL LRG LVL3 (GOWN DISPOSABLE) ×1
GOWN STRL REUS W/TWL XL LVL3 (GOWN DISPOSABLE) ×1
HEAD MOD COCR 28MM HD -3MM NK (Orthopedic Implant) IMPLANT
HOOD PEEL AWAY T7 (MISCELLANEOUS) ×3 IMPLANT
IV NS 100ML SINGLE PACK (IV SOLUTION) IMPLANT
IV NS IRRIG 3000ML ARTHROMATIC (IV SOLUTION) ×2 IMPLANT
KIT PREP HIP W/CEMENT RESTRICT (Miscellaneous) IMPLANT
KIT PREPARATION TOTAL HIP (Miscellaneous) ×1 IMPLANT
LABEL OR SOLS (LABEL) ×1 IMPLANT
MANIFOLD NEPTUNE II (INSTRUMENTS) ×1 IMPLANT
NDL FILTER BLUNT 18X1 1/2 (NEEDLE) ×1 IMPLANT
NDL SAFETY ECLIP 18X1.5 (MISCELLANEOUS) ×1 IMPLANT
NDL SPNL 20GX3.5 QUINCKE YW (NEEDLE) ×1 IMPLANT
NEEDLE FILTER BLUNT 18X1 1/2 (NEEDLE) ×1 IMPLANT
NEEDLE SPNL 20GX3.5 QUINCKE YW (NEEDLE) ×1 IMPLANT
NS IRRIG 500ML POUR BTL (IV SOLUTION) ×1 IMPLANT
PACK HIP PROSTHESIS (MISCELLANEOUS) ×1 IMPLANT
PRESSURIZER CEMENT PROX FEM SM (MISCELLANEOUS) IMPLANT
PRESSURIZER FEM CANAL M (MISCELLANEOUS) IMPLANT
PULSAVAC PLUS IRRIG FAN TIP (DISPOSABLE) ×1
RINGBLOC BI POLAR 28X47MM (Orthopedic Implant) ×1 IMPLANT
SHELL RINGBLOC BI POLR 28X47MM (Orthopedic Implant) IMPLANT
SPIKE FLUID TRANSFER (MISCELLANEOUS) ×2 IMPLANT
SPONGE T-LAP 18X18 ~~LOC~~+RFID (SPONGE) ×2 IMPLANT
STAPLER SKIN PROX 35W (STAPLE) ×1 IMPLANT
STEM FEM HIP STD 11X140 (Femur) IMPLANT
STRAP SAFETY 5IN WIDE (MISCELLANEOUS) ×1 IMPLANT
SUT TICRON 2-0 30IN 311381 (SUTURE) ×4 IMPLANT
SUT VIC AB 0 CT1 36 (SUTURE) ×1 IMPLANT
SUT VIC AB 1 CT1 36 (SUTURE) ×1 IMPLANT
SUT VIC AB 2-0 CT1 (SUTURE) ×2 IMPLANT
SYR 10ML LL (SYRINGE) ×1 IMPLANT
SYR 30ML LL (SYRINGE) ×3 IMPLANT
SYR TB 1ML LL NO SAFETY (SYRINGE) IMPLANT
TIP BRUSH PULSAVAC PLUS 24.33 (MISCELLANEOUS) IMPLANT
TIP FAN IRRIG PULSAVAC PLUS (DISPOSABLE) ×1 IMPLANT
TRAP FLUID SMOKE EVACUATOR (MISCELLANEOUS) ×2 IMPLANT
WATER STERILE IRR 1000ML POUR (IV SOLUTION) ×1 IMPLANT

## 2023-07-19 NOTE — Anesthesia Preprocedure Evaluation (Addendum)
Anesthesia Evaluation  Patient identified by MRN, date of birth, ID band Patient awake    Reviewed: Allergy & Precautions, H&P , NPO status , Patient's Chart, lab work & pertinent test results, reviewed documented beta blocker date and time   Airway Mallampati: II   Neck ROM: full    Dental  (+) Poor Dentition   Pulmonary former smoker   Pulmonary exam normal        Cardiovascular Exercise Tolerance: Poor hypertension, On Medications + CAD  Normal cardiovascular exam+ Valvular Problems/Murmurs MR  Rhythm:regular Rate:Normal     Neuro/Psych   Anxiety     negative neurological ROS  negative psych ROS   GI/Hepatic negative GI ROS, Neg liver ROS,,,  Endo/Other  negative endocrine ROS    Renal/GU negative Renal ROS  negative genitourinary   Musculoskeletal   Abdominal   Peds  Hematology negative hematology ROS (+)   Anesthesia Other Findings Past Medical History: No date: Abdominal hernia No date: Anxiety No date: CAD (coronary artery disease) No date: HLD (hyperlipidemia) No date: Hypertension Past Surgical History: No date: EXPLORATORY LAPAROTOMY BMI    Body Mass Index: 21.97 kg/m     Reproductive/Obstetrics negative OB ROS                             Anesthesia Physical Anesthesia Plan  ASA: 3 and emergent  Anesthesia Plan: Spinal   Post-op Pain Management:    Induction:   PONV Risk Score and Plan: 3  Airway Management Planned:   Additional Equipment:   Intra-op Plan:   Post-operative Plan:   Informed Consent: I have reviewed the patients History and Physical, chart, labs and discussed the procedure including the risks, benefits and alternatives for the proposed anesthesia with the patient or authorized representative who has indicated his/her understanding and acceptance.     Dental Advisory Given  Plan Discussed with: CRNA  Anesthesia Plan Comments: (DNR  situation discussed with daughter. She accepts and understands our policy. ja)        Anesthesia Quick Evaluation

## 2023-07-19 NOTE — ED Provider Notes (Signed)
[facility] Provider Note    Event Date/Time   First MD Initiated Contact with Patient 07/19/23 [phone]     (approximate)   History   Fall   HPI  Kelsey Martin is a 89 y.o. female with a history of high blood pressure, hyperlipidemia who presents after a fall.  Patient reports he lost her balance, fell yesterday evening, complains of mild discomfort in her left hip.  Was unable to get up.  Was discovered this morning.  No head injury.  She reports she is not on blood thinners.  No upper extremity injuries, no abdominal pain, no chest pain, no back pain     Physical Exam   Triage Vital Signs: ED Triage Vitals [07/19/23 0950]  Encounter Vitals Group     BP (!) 215/188     Systolic BP Percentile      Diastolic BP Percentile      Pulse Rate 87     Resp 18     Temp 98 F (36.7 C)     Temp Source Oral     SpO2 98 %     Weight      Height      Head Circumference      Peak Flow      Pain Score      Pain Loc      Pain Education      Exclude from Growth Chart     Most recent vital signs: Vitals:   07/19/23 1000 07/19/23 1100  BP: (!) 172/149   Pulse: 72 76  Resp: 17 17  Temp:    SpO2: 97% 99%     General: Awake, no distress.  CV:  Good peripheral perfusion.  No chest wall tenderness palpation Resp:  Normal effort.  Abd:  No distention.  Soft nontender Other:  No evidence of head injury, no vertebral tenderness palpation.  Good range of motion of all extremities.  No pain with axial load on the left hip, mild discomfort with significant flexion, warm and well-perfused distally   ED Results / Procedures / Treatments   Labs (all labs ordered are listed, but only abnormal results are displayed) Labs Reviewed  CBC - Abnormal; Notable for the following components:      Result Value   WBC 10.6 (*)    RBC 3.83 (*)    HCT 35.1 (*)    All other components within normal limits  COMPREHENSIVE METABOLIC PANEL - Abnormal; Notable for the  following components:   Sodium 127 (*)    Chloride 93 (*)    Glucose, Bld 115 (*)    All other components within normal limits  CK     EKG  ED ECG REPORT I, Jene Every, the attending physician, personally viewed and interpreted this ECG.  Date: 07/19/2023  Rhythm: Atrial fibrillation QRS Axis: normal Intervals: Abnormal ST/T Wave abnormalities: normal Narrative Interpretation: no evidence of acute ischemia    RADIOLOGY Hip x-ray viewed interpret by me, left-sided intertrochanteric hip fracture    PROCEDURES:  Critical Care performed:   Procedures   MEDICATIONS ORDERED IN ED: Medications  benazepril (LOTENSIN) tablet 20 mg (20 mg Oral Given 07/19/23 1018)  ceFAZolin (ANCEF) IVPB 2g/100 mL premix (has no administration in time range)  hydrALAZINE (APRESOLINE) injection 10 mg (10 mg Intravenous Given 07/19/23 1103)  bisoprolol (ZEBETA) tablet 5 mg (5 mg Oral Given 07/19/23 1018)     IMPRESSION / MDM / ASSESSMENT AND PLAN / ED COURSE  I reviewed the triage vital signs and the nursing notes. Patient's presentation is most consistent with acute presentation with potential threat to life or bodily function.   Patient presents after fall that occurred yesterday evening, she was unable to get up on her own.  She spent the night on the floor.  Her only complaint is left hip pain.  Her blood pressure is elevated and her EKG is consistent with atrial fibrillation, I do not see a note of history of atrial fibrillation, rate appears controlled at this time.  Exam generally reassuring, doubt fracture of the hip, pending x-ray of the left hip and pelvis  Labs pending.  Will give home medications for blood pressure  ----------------------------------------- 10:34 AM on 07/19/2023 ----------------------------------------- X-ray is consistent with left-sided hip fracture, have paged Dr. Joice Lofts of orthopedics   Lab work reviewed, patient has chronic hyponatremia  FINAL  CLINICAL IMPRESSION(S) / ED DIAGNOSES   Final diagnoses:  Closed fracture of left hip, initial encounter [facility])  Atrial fibrillation, unspecified type (HCC)  Chronic hyponatremia     Rx / DC Orders   ED Discharge Orders     None        Note:  This document was prepared using Dragon voice recognition software and may include unintentional dictation errors.   Jene Every, MD 07/19/23 1106

## 2023-07-19 NOTE — Progress Notes (Signed)
Had questions regarding this patient prior to admission to the unit.  Spoke with [facility] ED  nurse and she answered all questions. PCR swab was done by her as well she stated.

## 2023-07-19 NOTE — Transfer of Care (Signed)
Immediate Anesthesia Transfer of Care Note  Patient: Kelsey Martin  Procedure(s) Performed: ARTHROPLASTY BIPOLAR HIP (HEMIARTHROPLASTY) (Left: Hip)  Patient Location: PACU  Anesthesia Type:Spinal  Level of Consciousness: drowsy  Airway & Oxygen Therapy: Patient Spontanous Breathing and Patient connected to face mask oxygen  Post-op Assessment: Report given to RN  Post vital signs: stable  Last Vitals:  Vitals Value Taken Time  BP 163/108 07/19/23 1721  Temp    Pulse 72 07/19/23 1722  Resp    SpO2 100 % 07/19/23 1722  Vitals shown include unfiled device data.  Last Pain:  Vitals:   07/19/23 1341  TempSrc: Oral         Complications: No notable events documented.

## 2023-07-19 NOTE — ED Triage Notes (Signed)
Pt brought in via ems for fall at home. Pt states she fell at 5pm yesterday and was unable to get up. Pt states she got tripped up, denies LOC. Pt co of left hip pain. Pt able to move all extremities. Pt is A&Ox4. No Obvious injuries on assessment.

## 2023-07-19 NOTE — Progress Notes (Signed)
Initial Nutrition Assessment  DOCUMENTATION CODES:   Not applicable  INTERVENTION:   Ensure Enlive po BID, each supplement provides 350 kcal and 20 grams of protein.  MVI po daily   NUTRITION DIAGNOSIS:   Increased nutrient needs related to hip fracture as evidenced by estimated needs.  GOAL:   Patient will meet greater than or equal to 90% of their needs  MONITOR:   PO intake, Supplement acceptance, Labs, Weight trends, Skin, I & O's  REASON FOR ASSESSMENT:   Consult Hip fracture protocol  ASSESSMENT:   89 y/o female with h/o CAD, HTN, Afib, anxiety, depression, GERD and ventral hernia who is admitted with hip fracture after fall.  Unable to see pt today as pt in the OR at time of RD visit. Pt with increased estimated needs r/t hip fracture. Pt NPO for surgery today. RD will add supplements and MVI to help pt met her estimated needs. RD will obtain history and exam at follow up.   Medications reviewed and include: NaCl @75ml /hr  Labs reviewed: Na 126(L), K 3.5 wnl Wbc- 10.6(H)  NUTRITION - FOCUSED PHYSICAL EXAM: Unable to perform at this time   Diet Order:   Diet Order             Diet NPO time specified Except for: Sips with Meds, Ice Chips  Diet effective now                  EDUCATION NEEDS:   No education needs have been identified at this time  Skin:  Skin Assessment: Reviewed RN Assessment (incision L hip)  Last BM:  pta  Height:   Ht Readings from Last 1 Encounters:  07/19/23 5\' 5"  (1.651 m)    Weight:   Wt Readings from Last 1 Encounters:  07/19/23 59.9 kg    Ideal Body Weight:  56.8 kg  BMI:  Body mass index is 21.97 kg/m.  Estimated Nutritional Needs:   Kcal:  1400-1600kcal/day  Protein:  70-80g/day  Fluid:  1.4-1.6L/day  Betsey Holiday MS, RD, LDN Please refer to [facility] for RD and/or RD on-call/weekend/after hours pager

## 2023-07-19 NOTE — TOC Initial Note (Signed)
Transition of Care [facility]) - Initial/Assessment Note    Patient Details  Name: Kelsey Martin MRN: [id number] Date of Birth: 02/22/1930  Transition of Care [facility]) CM/SW Contact:    Kreg Shropshire, RN Phone Number: 07/19/2023, 12:21 PM  Clinical Narrative:                  Pt arrived from ED from: Home lives with daughter and son in law Caregiver Support: Daughter and son in law DME at Home: [facility] and walker Transportation: Family Previous Services: None HH/SNF Preference: None First Person of Contact: Daughter Fraser Din [phone] PCP: Aram Beecham, MD  Cm will continue to follow for toc needs and d/c planning.    Barriers to Discharge: Continued Medical Work up   Patient Goals and CMS Choice   CMS Medicare.gov Compare Post Acute Care list provided to:: Patient Represenative (must comment) Choice offered to / list presented to : Patient, Adult Children      Expected Discharge Plan and Services     Post Acute Care Choice: Home Health, Skilled Nursing Facility Living arrangements for the past 2 months: Single Family Home                                      Prior Living Arrangements/Services Living arrangements for the past 2 months: Single Family Home Lives with:: Self, Adult Children          Need for Family Participation in Patient Care: Yes (Comment) Care giver support system in place?: Yes (comment) Current home services: DME    Activities of Daily Living Home Assistive Devices/Equipment: Eyeglasses ADL Screening (condition at time of admission) Patient's cognitive ability adequate to safely complete daily activities?: Yes Patient able to express need for assistance with ADLs?: Yes Independently performs ADLs?: No  Permission Sought/Granted            Permission granted to share info w Relationship: Daughter Fraser Din [phone]     Emotional Assessment              Admission diagnosis:  Closed fracture of left hip [facility])  [S72.002A] Patient Active Problem List   Diagnosis Date Noted   Closed fracture of left hip (HCC) 07/19/2023   Fall at home, initial encounter 07/19/2023   New onset atrial fibrillation (HCC) 07/19/2023   CAD (coronary artery disease)    Hypertension    Chronic hyponatremia 08/01/2019   PCP:  Marguarite Arbour, MD Pharmacy:   [facility] [phone] [place], [place] - [address] [place] [address] Phone: [phone] Fax: [phone]     Social Determinants of Health (SDOH) Social History: SDOH Screenings   Food Insecurity: No Food Insecurity (07/19/2023)  Housing: Low Risk  (07/19/2023)  Transportation Needs: No Transportation Needs (07/19/2023)  Utilities: Not At Risk (07/19/2023)  Tobacco Use: Medium Risk (07/19/2023)   SDOH Interventions:     Readmission Risk Interventions     No data to display

## 2023-07-19 NOTE — Progress Notes (Signed)
Brief Cardiology Consult note  I evaluated Kelsey Martin at the bedside this AM with daughter present and discussed with Dr. Darrold Junker - she's euvolemic without chest pain or HF symptoms on exam. In new onset, rate controlled AF on tele. Recommend continuing nebivilol pre, peri, and post procedurally. Start eliquis post procedurally for stroke prevention as soon as safely able from ortho's standpoint. She is low risk for surgery from a cardiac perspective without further modifiable risk factors or cardiac diagnostics recommended prior to proceeding with orthopedic surgery for repair of her left hip fracture this afternoon with Dr. Joice Lofts.  Rebeca Allegra, PA-C   Full cardiology note to follow:

## 2023-07-19 NOTE — Plan of Care (Signed)

## 2023-07-19 NOTE — Consult Note (Signed)
[facility] CLINIC CARDIOLOGY CONSULT NOTE       Patient ID: Kelsey Martin MRN: [id number] DOB/AGE: Aug 14, 1930 89 y.o.  Admit date: 07/19/2023 Referring Physician Dr. Clyde Lundborg  Primary Physician Dr. Judithann Sheen Primary Cardiologist prev Dr. Gwen Pounds (last seen in 2018)  Reason for Consultation medical optimization prior to orthopedic surgery  HPI: Kelsey Martin is a 89yoF with a PMH of HFpEF, mod MR (04/2017), HTN, hx TIA, who presented to [facility] ED 07/19/2023 after losing her balance and falling on her left hip the evening of 7/29.  X-ray of the left hip demonstrated a left femoral neck fracture for which orthopedic surgery is planning surgical repair this afternoon.  EKG on admission demonstrated new onset, rate controlled atrial fibrillation.  Cardiology is consulted for medical optimization prior to proceeding with orthopedic surgery.  The patient presents with her daughter who contributes to the history.  The patient lives at home with her daughter Avon Gully) and son-in-law who look after her and assist with ADLs.  She normally ambulates throughout the home without physical assistance or walker/cane, but was walking from the bathroom to her recliner yesterday afternoon when "her feet busted up" on her, losing her balance and falling on her left hip.  She was unable to bear weight and was initially resistant to coming to the hospital yesterday evening, but was agreeable to EMS assistance and transport to the hospital this morning.  She denied any preceding chest pain, lightheadedness or dizziness, or syncope before her fall.  She has chronic (at least 1 year) of exertional dyspnea as she ambulates between rooms in her home but this has not changed.  She denies orthopnea, PND, or lower extremity edema.  At my time of evaluation this morning she is lying flat in the ED stretcher with her daughter present.  She currently denies chest pain, shortness of breath, lightheadedness or dizziness.  She is a little nervous in  general regarding her hospitalization and upcoming surgery but is in no acute distress.  Admission EKG demonstrates atrial fibrillation with rate 71 bpm, telemetry shows predominant atrial fibrillation with rate in the 70s to 80s.  She is hypertensive to 183/98 at my time of evaluation, but laying flat and comfortable on room air saturating well.  Review of systems complete and found to be negative unless listed above     Past Medical History:  Diagnosis Date   Abdominal hernia    Anxiety    CAD (coronary artery disease)    HLD (hyperlipidemia)    Hypertension     Past Surgical History:  Procedure Laterality Date   EXPLORATORY LAPAROTOMY      (Not in a hospital admission)  Social History   Socioeconomic History   Marital status: Widowed    Spouse name: Not on file   Number of children: Not on file   Years of education: Not on file   Highest education level: Not on file  Occupational History   Not on file  Tobacco Use   Smoking status: Former   Smokeless tobacco: Never  Substance and Sexual Activity   Alcohol use: Never   Drug use: Never   Sexual activity: Not on file  Other Topics Concern   Not on file  Social History Narrative   Not on file   Social Determinants of Health   Financial Resource Strain: Not on file  Food Insecurity: Not on file  Transportation Needs: Not on file  Physical Activity: Not on file  Stress: Not on file  Social Connections: Not on file  Intimate Partner Violence: Not on file    Family History  Problem Relation Age of Onset   Breast cancer Neg Hx      No intake or output data in the 24 hours ending 07/19/23 1131  Vitals:   07/19/23 0950 07/19/23 1000 07/19/23 1100  BP: (!) 215/188 (!) 172/149   Pulse: 87 72 76  Resp: 18 17 17   Temp: 98 F (36.7 C)    TempSrc: Oral    SpO2: 98% 97% 99%    PHYSICAL EXAM General: Pleasant thin elderly female, in no acute distress.  Laying flat in ED stretcher with daughter present HEENT:   Normocephalic and atraumatic. Neck:  No JVD.  Lungs: Normal respiratory effort on room air. Clear bilaterally to auscultation. No wheezes, crackles, rhonchi.  Heart: Irregularly irregular with controlled rate. Normal S1 and S2 without gallops or murmurs.  Abdomen: Non-distended appearing.  Msk: Normal strength and tone for age. Extremities: Warm and well perfused. No clubbing, cyanosis.  No peripheral edema.  Neuro: Alert and oriented X 3, able to provide a complete history. Psych:  Answers questions appropriately.   Labs: Basic Metabolic Panel: Recent Labs    07/19/23 0957  NA 127*  K 3.5  CL 93*  CO2 23  GLUCOSE 115*  BUN 9  CREATININE 0.56  CALCIUM 9.7   Liver Function Tests: Recent Labs    07/19/23 0957  AST 25  ALT 16  ALKPHOS 49  BILITOT 1.2  PROT 6.8  ALBUMIN 3.9   No results for input(s): "LIPASE", "AMYLASE" in the last 72 hours. CBC: Recent Labs    07/19/23 0957  WBC 10.6*  HGB 12.5  HCT 35.1*  MCV 91.6  PLT 240   Cardiac Enzymes: Recent Labs    07/19/23 0957  CKTOTAL 43   BNP: No results for input(s): "BNP" in the last 72 hours. D-Dimer: No results for input(s): "DDIMER" in the last 72 hours. Hemoglobin A1C: No results for input(s): "HGBA1C" in the last 72 hours. Fasting Lipid Panel: No results for input(s): "CHOL", "HDL", "LDLCALC", "TRIG", "CHOLHDL", "LDLDIRECT" in the last 72 hours. Thyroid Function Tests: No results for input(s): "TSH", "T4TOTAL", "T3FREE", "THYROIDAB" in the last 72 hours.  Invalid input(s): "FREET3" Anemia Panel: No results for input(s): "VITAMINB12", "FOLATE", "FERRITIN", "TIBC", "IRON", "RETICCTPCT" in the last 72 hours.   Radiology: [facility] Suite Chest Port 1 View  Result Date: 07/19/2023 CLINICAL DATA:  Hip fracture EXAM: PORTABLE CHEST 1 VIEW COMPARISON:  08/01/2019 FINDINGS: Hyperinflation. Enlarged cardiopericardial silhouette. Calcified aorta. Apical pleural thickening. No consolidation, pneumothorax or effusion. No  edema. Overlapping cardiac leads. Hiatal hernia. The extreme inferior costophrenic angles are clipped off the edge of the film. IMPRESSION: Hyperinflation. Enlarged heart. Hiatal hernia. No consolidation or effusion. Electronically Signed   By: Karen Kays M.D.   On: 07/19/2023 11:06   DG Hip Unilat W or Wo Pelvis 2-3 Views Left  Result Date: 07/19/2023 CLINICAL DATA:  Hip fracture EXAM: DG HIP (WITH OR WITHOUT PELVIS) 3V LEFT COMPARISON:  None Available. FINDINGS: Osteopenia. Slightly displaced subcapital left femoral neck fracture. No additional fracture or dislocation. Preserved joint spaces. Scattered vascular calcifications identified. There is bony fusion across the pubic symphysis. Overlapping cardiac leads. Note is made of diffuse colonic stool. Degenerative changes of the visualized portions of the lower lumbar spine. IMPRESSION: Severe osteopenia.  Left-sided subcapital femoral neck fracture. Electronically Signed   By: Karen Kays M.D.   On: 07/19/2023 10:43  ECHO TTE 08/2014 DOPPLER ECHO and OTHER SPECIAL PROCEDURES     Aortic:No AR                          No AS      Mitral:MODERATE MR                    No MS            MV Inflow E Vel=115.0 cm/sec   MV Annulus E'Vel=nm*            E/E'Ratio=nm*   Tricuspid:MILD TR                        No TS            270.0 cm/sec peak TR vel       34.4 mmHg peak RV pressure   Pulmonary:No PR                          No PS  ______________________________________________________________  INTERPRETATION  NORMAL LEFT VENTRICULAR SYSTOLIC FUNCTION  NORMAL RIGHT VENTRICULAR SYSTOLIC FUNCTION  MODERATE VALVULAR REGURGITATION (See above)  NO VALVULAR STENOSIS  Elevated right sided heart pressure.  Mod/severe MR noted   _____________________________________________________________________  Electronically signed by: Danella Penton, MD on 09/03/2014 04:27 PM              Performed By: Carter Kitten, RCS, RVS        Ordering Physician:  Judithann Sheen, JEFFREY   TELEMETRY reviewed by me (LT) 07/19/2023 : AF rate 70s to 80s  EKG reviewed by me: AF 71 bpm  Data reviewed by me (LT) 07/19/2023: Last outpatient cardiology note, ED note, orthopedic surgery note last 24h vitals tele labs imaging I/O   Principal Problem:   Closed fracture of left hip [facility]) Active Problems:   Chronic hyponatremia   CAD (coronary artery disease)   Hypertension   Fall at home, initial encounter   New onset atrial fibrillation [facility])    ASSESSMENT AND PLAN:  Shanya B. Wessler is a 89yoF with a PMH of HFpEF, mod MR (04/2017), HTN, hx TIA, who presented to [facility] ED 07/19/2023 after losing her balance and falling on her left hip the evening of 7/29.  X-ray of the left hip demonstrated a left femoral neck fracture for which orthopedic surgery is planning surgical repair this afternoon.  EKG on admission demonstrated new onset, rate controlled atrial fibrillation.  Cardiology is consulted for medical optimization prior to proceeding with orthopedic surgery.  # Left femoral neck fracture # Medical optimization prior to orthopedic surgery # Chronic HFpEF, moderate MR Presents to [facility] the morning after a mechanical fall and unfortunately suffering a left femoral neck fracture, without preceding chest pain, lightheadedness, dizziness, or syncope, low suspicion for an arrhythmogenic cause of her fall.  She has a history of HFpEF and moderate MR by echo in 2015, with chronic dyspnea on exertion that is unchanged.  She appears euvolemic, laying flat on room air, without active heart failure symptoms.  EKG demonstrates new onset paroxysmal AF as below, but rate controlled on her home dose of bisoprolol 5 mg daily. -Agree with current therapy per primary team and orthopedic surgery -Echo complete to be performed at some point during this hospitalization, but this does not need to preclude surgical intervention -Continue bisoprolol pre-, peri-, and post procedurally - continue  atorvastatin 20mg   daily  -The patient is low risk from a cardiac perspective to proceed with orthopedic surgery without further modifiable risk factors or cardiac diagnostics recommended prior to proceeding.  # New onset paroxysmal AF On admission EKG, rate controlled on telemetry in the 70s to 80s on home bisoprolol 5 mg daily.  CHA2DS2-VASc 6 (age, sex, HTN, hx TIA).  Discussed the risks and benefits of starting chronic anticoagulation with a DOAC versus warfarin for stroke prevention the patient and her daughter Avon Gully).  They prefer to start chronic anticoagulation with Eliquis for stroke prevention.  Most recent weight documented is 59.9 kg, the patient is 89 years old, meeting criteria for dose reduced Eliquis to 2.5 mg twice daily.  Will plan to start this post procedurally, once okay from an orthopedic surgery standpoint.   # Essential hypertension On clonidine patch q weekly, bisoprolol 5 mg daily, benazepril 20 mg twice daily,  BP above goal today, likely in the setting of pain from her fall.  Will arrange for clinic follow-up with Dr. Darrold Junker 1-2 weeks after discharge.  This patient's plan of care was discussed and created with Dr. Darrold Junker and he is in agreement.  Signed: Rebeca Allegra , PA-C 07/19/2023, 11:31 AM [facility] Cardiology

## 2023-07-19 NOTE — Anesthesia Procedure Notes (Signed)
Spinal  Patient location during procedure: OR Start time: 07/19/2023 2:42 PM End time: 07/19/2023 2:49 PM Reason for block: surgical anesthesia Staffing Performed: resident/CRNA  Resident/CRNA: Morene Crocker, CRNA Performed by: Morene Crocker, CRNA Authorized by: Reed Breech, MD   Preanesthetic Checklist Completed: patient identified, IV checked, site marked, risks and benefits discussed, surgical consent, monitors and equipment checked, pre-op evaluation and timeout performed Spinal Block Patient position: right lateral decubitus Prep: ChloraPrep Patient monitoring: heart rate, continuous pulse ox and blood pressure Approach: midline Location: L4-5 Injection technique: single-shot Needle Needle type: Introducer and Pencan  Needle gauge: 24 G Needle length: 9 cm Assessment Sensory level: T8 Events: CSF return Additional Notes Negative paresthesia. Negative blood return. Positive free-flowing CSF. Expiration date of kit checked and confirmed. Patient tolerated procedure well, without complications. Pt. Tolerated procedure well with mild sedation as documented on anesthesia chart. No c/o pain/discomfort. No anesthesia complications noted.

## 2023-07-19 NOTE — Op Note (Signed)
07/19/2023  5:25 PM  Patient:   Kelsey Martin  Pre-Op Diagnosis:   Displaced femoral neck fracture, left hip.  Post-Op Diagnosis:   Same.  Procedure:   Left hip bipolar hemiarthroplasty.  Surgeon:   Maryagnes Amos, MD  Assistant:   Waymon Amato, RNFA; Edmon Crape, PA-S  Anesthesia:   Spinal  Findings:   As above.  Complications:   None  EBL:   75 cc  Fluids:   500 cc crystalloid  UOP:   None  TT:   None  Drains:   None  Closure:   Staples  Implants:   Biomet press-fit system with a # #11 cemented standard offset reduced proximal profile Echo collared femoral stem, a 47 mm outer diameter shell, and a 28 mm head with a -3 mm neck adapter.  Brief Clinical Note:   The patient is a 89 year old female who sustained the above-noted injury last evening when she fell while at home. She was brought to the emergency room this morning by her family. The patient has been cleared medically and presents at this time for definitive management of the injury.  Procedure:   The patient was brought into the operating room. After adequate spinal anesthesia was obtained, the patient was repositioned in the right lateral decubitus position and secured using a lateral hip positioner. The left hip and lower extremity were prepped with ChloroPrep solution before being draped sterilely. Preoperative antibiotics were administered. A timeout was performed to verify the appropriate surgical site.    A standard posterior approach to the hip was made through an approximately 4-5 inch incision. The incision was carried down through the subcutaneous tissues to expose the gluteal fascia and proximal end of the iliotibial band. These structures were split the length of the incision and the Charnley self-retaining hip retractor placed. The bursal tissues were swept posteriorly to expose the short external rotators. The anterior border of the piriformis tendon was identified and this plane developed down  through the capsule to enter the joint. Abundant fracture hematoma was suctioned. A flap of tissue was elevated off the posterior aspect of the femoral neck and greater trochanter and retracted posteriorly. This flap included the piriformis tendon, the short external rotators, and the posterior capsule. The femoral head was removed in its entirety, then taken to the back table where it was measured and found to be optimally replicated by a 47 mm head. The appropriate trial head was inserted and found to demonstrate an excellent suction fit.   Attention was directed to the femoral side. The femoral neck was recut 10-12 mm above the lesser trochanter using an oscillating saw. The piriformis fossa was debrided of soft tissues before the intramedullary canal was accessed through this point using a triple step reamer. The canal was reamed sequentially beginning with a #7 tapered reamer and progressing to a #13 tapered reamer. This provided excellent circumferential chatter. A box osteotome was used to establish version before the canal was broached sequentially beginning with a #9 broach and progressing to a #13 broach. This was left in place and several trial reductions performed using the -3 mm and -6 mm neck options.  The hip demonstrated excellent stability both in extension and external rotation as well as with flexion to 90 and internal rotation beyond 70. It also was stable in the position of sleep.  The femoral canal was prepared for cementing by irrigating the canal thoroughly then inserting a #3 distal cement restrictor.  The canal was  packed with a Neo-Synephrine soaked gauze while the cement was mixed on the back table.  When it was ready, the cement was injected into the canal with a cement gun and pressurized before the #11 collared cemented stem was inserted and positioned while maintaining the appropriate anteversion.  The excess cement was removed and the femoral stem held in place while the cement  hardened.  Once the cement hardened, a repeat trial reduction was performed using both the -6 mm and -3 mm neck lengths. The -3 mm neck length demonstrated excellent stability both in extension and external rotation as well as with flexion to 90 and internal rotation beyond 70. It also was stable in the position of sleep. The 47 mm outer diameter shell with the 28 mm head and -3 mm neck adapter construct was put together on the back table before being impacted onto the stem of the femoral component. The Morse taper locking mechanism was verified using manual distraction before the head was relocated and the hip placed through a range of motion with the findings as described above.  The wound was copiously irrigated with sterile saline solution via the jet lavage system before the peri-incisional and pericapsular tissues were injected with a "cocktail" of 20 cc of Exparel, 30 cc of 0.5% Sensorcaine, 2 cc of Kenalog 40 (80 mg), and 30 mg of Toradol diluted out to 60 cc with normal saline was injected to help with postoperative analgesia. The posterior flap was reapproximated to the posterior aspect of the greater trochanter using #2 Tycron interrupted sutures placed through drill holes. The iliotibial band was reapproximated using #1 Vicryl interrupted sutures before the gluteal fascia was closed using a running #0 Vicryl suture. At this point, 1 g of transexemic acid in 10 cc of normal saline was injected into the joint to help reduce postoperative bleeding. The subcutaneous tissues were closed in several layers using 2-0 Vicryl interrupted sutures before the skin was closed using staples. A sterile occlusive dressing was applied to the wound . The patient then was rolled back into the supine position on the hospital bed before being awakened and returned to the recovery room in satisfactory condition after tolerating the procedure well.

## 2023-07-19 NOTE — Anesthesia Postprocedure Evaluation (Signed)
Anesthesia Post Note  Patient: Kelsey Martin  Procedure(s) Performed: ARTHROPLASTY BIPOLAR HIP (HEMIARTHROPLASTY) (Left: Hip)  Patient location during evaluation: PACU Anesthesia Type: Spinal Level of consciousness: awake and alert, oriented and patient cooperative Pain management: pain level controlled Vital Signs Assessment: post-procedure vital signs reviewed and stable Respiratory status: spontaneous breathing, nonlabored ventilation and respiratory function stable Cardiovascular status: blood pressure returned to baseline and stable Postop Assessment: adequate PO intake, no headache, no backache and spinal receding Anesthetic complications: no   No notable events documented.   Last Vitals:  Vitals:   07/19/23 1830 07/19/23 1849  BP: (!) 151/84 (!) 172/95  Pulse: 88 72  Resp: (!) 24 15  Temp: 36.6 C (!) 36.4 C  SpO2: 96% 97%    Last Pain:  Vitals:   07/19/23 1341  TempSrc: Oral                 Reed Breech

## 2023-07-19 NOTE — H&P (Signed)
History and Physical    [name]:[id number] DOB: 1930/02/25 DOA: 07/19/2023  Referring MD/NP/PA:   PCP: Marguarite Arbour, MD   Patient coming from:  The patient is coming from home.     Chief Complaint: fall and left hip pain  HPI: Kelsey Martin is a 89 y.o. female with medical history significant of CAD, HLD, HTN, anxiety, chronic hyponatremia (120-130), who presents with fall and left hip pain.  Per her daughter at the bedside, patient has high risk for fall. She has had several falls recently.  He fell again accidentally yesterday afternoon at about 5 PM, no loss of consciousness.  She injured her left hip, causing pain in the left hip, which is constant, sharp, severe, nonradiating.  Aggravated by movement.  Patient does not have unilateral numbness or tingling in extremities.  No facial droop or slurred speech.  Patient does not have chest pain, cough, and SOB.  Patient does not have nausea vomiting or abdominal pain.  She reports several episodes of loose stool bowel movement since yesterday.  No symptoms of UTI.  Patient was found to have new onset atrial fibrillation in ED, with heart rate in 70s to 80s.   Data reviewed independently and ED Course: pt was found to have WBC 10.6, GFR> 60, sodium 127, temperature normal, blood pressure 215/88, 172/149, heart rate 87, RR 18, oxygen saturation 97% on room air.  Chest x-ray negative for infiltration, showed cardiomegaly.  X-ray of left hip/pelvis that showed subcapital femoral neck fracture.  Patient is admitted to telemetry bed as inpatient.  Dr. Joice Lofts of Ortho and Dr. Darrold Junker of cardiology are consulted.  CT-head: 1.  No CT evidence of intracranial injury  2. Compared to prior exam there is an age indeterminate, but possibly acute infarct in the right frontoparietal region. Recommend brain MRI for further evaluation.   EKG: I have personally reviewed.  A-fib, RAD, QTc 420  Review of Systems:   General: no fevers,  chills, no body weight gain, has fatigue HEENT: no blurry vision, hearing changes or sore throat Respiratory: no dyspnea, coughing, wheezing CV: no chest pain, no palpitations GI: no nausea, vomiting, abdominal pain, diarrhea, constipation GU: no dysuria, burning on urination, increased urinary frequency, hematuria  Ext: no leg edema Neuro: no unilateral weakness, numbness, or tingling, no vision change or hearing loss. Has fall Skin: no rash, no skin tear. MSK: has right hip pain Heme: No easy bruising.  Travel history: No recent long distant travel.   Allergy:  Allergies  Allergen Reactions   Prozac [Fluoxetine] Other (See Comments)    Severe lethargy - seems to apply to all antidepressants    Amlodipine Swelling    Past Medical History:  Diagnosis Date   Abdominal hernia    Anxiety    CAD (coronary artery disease)    HLD (hyperlipidemia)    Hypertension     Past Surgical History:  Procedure Laterality Date   EXPLORATORY LAPAROTOMY      Social History:  reports that she has quit smoking. She has never used smokeless tobacco. She reports that she does not drink alcohol and does not use drugs.  Family History:  Family History  Problem Relation Age of Onset   Hypertension Mother    Heart attack Mother    Hypertension Brother      Prior to Admission medications   Medication Sig Start Date End Date Taking? Authorizing Provider  ALPRAZolam Prudy Feeler) 0.5 MG tablet Take 0.5 mg by mouth  at bedtime as needed for sleep. 04/19/19   [provider]  aspirin EC 81 MG tablet Take 81 mg by mouth daily.    [provider]  atorvastatin (LIPITOR) 20 MG tablet Take 20 mg by mouth daily. 03/14/19   [provider]  benazepril (LOTENSIN) 20 MG tablet Take 20 mg by mouth 2 (two) times a day. 03/19/19   [provider]  bimatoprost (LUMIGAN) 0.01 % SOLN Place 1 drop into both eyes at bedtime.    [provider]  brimonidine (ALPHAGAN) 0.2 %  ophthalmic solution Place 1 drop into the left eye at bedtime.  03/15/19   [provider]  dorzolamide-timolol (COSOPT) 22.3-6.8 MG/ML ophthalmic solution Place 1 drop into both eyes 2 (two) times a day.    [provider]  hydrALAZINE (APRESOLINE) 50 MG tablet Take 1 tablet (50 mg total) by mouth 4 (four) times daily. 07/03/19   Emily Filbert, MD  HYDROcodone-acetaminophen (NORCO/VICODIN) 5-325 MG tablet Take 1 tablet by mouth every 6 (six) hours as needed for pain. 05/28/19   [provider]  isosorbide mononitrate (IMDUR) 60 MG 24 hr tablet Take 60 mg by mouth daily.    [provider]  niacin 500 MG tablet Take 500 mg by mouth daily.    [provider]  ondansetron (ZOFRAN-ODT) 4 MG disintegrating tablet Take 4 mg by mouth every 8 (eight) hours as needed for nausea or vomiting.    [provider]    Physical Exam: Vitals:   07/19/23 1000 07/19/23 1100 07/19/23 1200 07/19/23 1341  BP: (!) 172/149  (!) 183/93 (!) 199/93  Pulse: 72 76 70 85  Resp: 17 17  20   Temp:    (!) 97 F (36.1 C)  TempSrc:    Oral  SpO2: 97% 99% 97% 95%  Weight:    59.9 kg  Height:    5\' 5"  (1.651 m)   General: Not in acute distress HEENT:       Eyes: PERRL, EOMI, no jaundice       ENT: No discharge from the ears and nose, no pharynx injection, no tonsillar enlargement.        Neck: No JVD, no bruit, no mass felt. Heme: No neck lymph node enlargement. Cardiac: S1/S2, irregularly irregular rhythm, No murmurs, No gallops or rubs. Respiratory: No rales, wheezing, rhonchi or rubs. GI: Soft, nondistended, nontender, no rebound pain, no organomegaly, BS present. GU: No hematuria Ext: No pitting leg edema bilaterally. 1+DP/PT pulse bilaterally. Musculoskeletal: has tenderness in left hip Skin: No rashes.  Neuro: Alert, oriented X3, cranial nerves II-XII grossly intact, moves all extremities. Psych: Patient is not psychotic, no suicidal or hemocidal  ideation.  Labs on Admission: I have personally reviewed following labs and imaging studies  CBC: Recent Labs  Lab 07/19/23 0957  WBC 10.6*  HGB 12.5  HCT 35.1*  MCV 91.6  PLT 240   Basic Metabolic Panel: Recent Labs  Lab 07/19/23 0957 07/19/23 1233  NA 127* 126*  K 3.5 3.5  CL 93* 93*  CO2 23 24  GLUCOSE 115* 119*  BUN 9 9  CREATININE 0.56 0.69  CALCIUM 9.7 9.6   GFR: Estimated Creatinine Clearance: 42.1 mL/min (by C-G formula based on SCr of 0.69 mg/dL). Liver Function Tests: Recent Labs  Lab 07/19/23 0957  AST 25  ALT 16  ALKPHOS 49  BILITOT 1.2  PROT 6.8  ALBUMIN 3.9   No results for input(s): "LIPASE", "AMYLASE" in the last 168  hours. No results for input(s): "AMMONIA" in the last 168 hours. Coagulation Profile: Recent Labs  Lab 07/19/23 1132  INR 1.1   Cardiac Enzymes: Recent Labs  Lab 07/19/23 0957  CKTOTAL 43   BNP (last 3 results) No results for input(s): "PROBNP" in the last 8760 hours. HbA1C: No results for input(s): "HGBA1C" in the last 72 hours. CBG: No results for input(s): "GLUCAP" in the last 168 hours. Lipid Profile: No results for input(s): "CHOL", "HDL", "LDLCALC", "TRIG", "CHOLHDL", "LDLDIRECT" in the last 72 hours. Thyroid Function Tests: No results for input(s): "TSH", "T4TOTAL", "FREET4", "T3FREE", "THYROIDAB" in the last 72 hours. Anemia Panel: No results for input(s): "VITAMINB12", "FOLATE", "FERRITIN", "TIBC", "IRON", "RETICCTPCT" in the last 72 hours. Urine analysis:    Component Value Date/Time   COLORURINE STRAW (A) 08/01/2019 1801   APPEARANCEUR CLEAR (A) 08/01/2019 1801   APPEARANCEUR Clear 08/10/2014 1654   LABSPEC 1.004 (L) 08/01/2019 1801   LABSPEC 1.004 08/10/2014 1654   PHURINE 7.0 08/01/2019 1801   GLUCOSEU NEGATIVE 08/01/2019 1801   GLUCOSEU Negative 08/10/2014 1654   HGBUR NEGATIVE 08/01/2019 1801   BILIRUBINUR NEGATIVE 08/01/2019 1801   BILIRUBINUR Negative 08/10/2014 1654   KETONESUR NEGATIVE  08/01/2019 1801   PROTEINUR NEGATIVE 08/01/2019 1801   NITRITE NEGATIVE 08/01/2019 1801   LEUKOCYTESUR NEGATIVE 08/01/2019 1801   LEUKOCYTESUR Negative 08/10/2014 1654   Sepsis Labs: @LABRCNTIP (procalcitonin:4,lacticidven:4) ) Recent Results (from the past 240 hour(s))  Surgical PCR screen     Status: None   Collection Time: 07/19/23  9:57 AM   Specimen: Nasal Mucosa; Nasal Swab  Result Value Ref Range Status   MRSA, PCR NEGATIVE NEGATIVE Final   Staphylococcus aureus NEGATIVE NEGATIVE Final    Comment: (NOTE) The Xpert SA Assay (FDA approved for NASAL specimens in patients 70 years of age and older), is one component of a comprehensive surveillance program. It is not intended to diagnose infection nor to guide or monitor treatment. Performed at [facility], [address] [place], [place] [address]      Radiological Exams on Admission: CT HEAD WO CONTRAST ( )  Result Date: 07/19/2023 CLINICAL DATA:  Head trauma, minor (Age >= 65y) EXAM: CT HEAD WITHOUT CONTRAST TECHNIQUE: Contiguous axial images were obtained from the base of the skull through the vertex without intravenous contrast. RADIATION DOSE REDUCTION: This exam was performed according to the departmental dose-optimization program which includes automated exposure control, adjustment of the mA and/or kV according to patient size and/or use of iterative reconstruction technique. COMPARISON:  Head CT 07/03/2019. FINDINGS: Brain: Compared to prior exam there is an age indeterminate, but possibly acute infarct in the right frontoparietal region. 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 No extra-axial fluid collection. No hydrocephalus. No mass effect. No mass lesion Vascular: No hyperdense vessel or unexpected calcification. Skull: Normal. Negative for fracture or focal lesion. Sinuses/Orbits: No middle ear or mastoid effusion. Paranasal sinuses are clear. Orbits are notable for  bilateral lens replacement, otherwise unremarkable. Other: None. IMPRESSION: 1.  No CT evidence of intracranial injury 2. Compared to prior exam there is an age indeterminate, but possibly acute infarct in the right frontoparietal region. Recommend brain MRI for further evaluation. Electronically Signed   By: Lorenza Cambridge M.D.   On: 07/19/2023 12:42   DG Chest Port 1 View  Result Date: 07/19/2023 CLINICAL DATA:  Hip fracture EXAM: PORTABLE CHEST 1 VIEW COMPARISON:  08/01/2019 FINDINGS: Hyperinflation. Enlarged cardiopericardial silhouette. Calcified aorta. Apical pleural thickening. No consolidation,  pneumothorax or effusion. No edema. Overlapping cardiac leads. Hiatal hernia. The extreme inferior costophrenic angles are clipped off the edge of the film. IMPRESSION: Hyperinflation. Enlarged heart. Hiatal hernia. No consolidation or effusion. Electronically Signed   By: Karen Kays M.D.   On: 07/19/2023 11:06   DG Hip Unilat W or Wo Pelvis 2-3 Views Left  Result Date: 07/19/2023 CLINICAL DATA:  Hip fracture EXAM: DG HIP (WITH OR WITHOUT PELVIS) 3V LEFT COMPARISON:  None Available. FINDINGS: Osteopenia. Slightly displaced subcapital left femoral neck fracture. No additional fracture or dislocation. Preserved joint spaces. Scattered vascular calcifications identified. There is bony fusion across the pubic symphysis. Overlapping cardiac leads. Note is made of diffuse colonic stool. Degenerative changes of the visualized portions of the lower lumbar spine. IMPRESSION: Severe osteopenia.  Left-sided subcapital femoral neck fracture. Electronically Signed   By: Karen Kays M.D.   On: 07/19/2023 10:43      Assessment/Plan Principal Problem:   Closed fracture of left hip [facility]) Active Problems:   Fall at home, initial encounter   New onset atrial fibrillation (HCC)   CAD (coronary artery disease)   Hypertension   Hypertensive urgency   Chronic hyponatremia   Diarrhea   Assessment and  Plan:  Closed fracture of left hip [facility]): As evidenced by x-ray, pt has subcapital femoral neck fracture.  No neurovascular compromise. Orthopedic surgeon, Dr. Joice Lofts was consulted. Due to new onset of A-fib, consulted Dr. Darrold Junker of cardiology.  Patient is cleared by cardiologist for surgery.  - will admit to tele bed as inpt - Pain control: prn morphine, percocet and tyleno - When necessary Zofran for nausea - Robaxin for muscle spasm - Lidoderm patch for pain - type and cross - INR/PTT - PT/OT when able to (not ordered now)  Fall at home, initial encounter: -PT/OT when able to   New onset atrial fibrillation (HCC): HR 70-80s. No chest pain or shortness breath.  CHADS2 score is 7. Will need anticoagulants chronically after surgery.  Consulted Dr. Darrold Junker of cardiology -Telemetry monitoring -2D echo -Check a TSH  CAD (coronary artery disease) -ASA and lipitor  Hypertension and Hypertensive urgency -IV hydralazine as needed -Hold Lotensin due to hyponatremia -Zebeta, oral hydralazine, imdur  Chronic hyponatremia: Recent sodium 120-130.  Her sodium is 127.  Mental status normal. -IV normal saline at 75 cc/h -Will need to follow with the restriction when able to eat  Diarrhea -Check C. difficile  Abnormal findings of the CT of the head for possible stroke:  CT-head showed possible age indeterminate infarct in the right frontoparietal region.  Pt does not have focal neurodeficit on physical examination. -continue ASA and lipitor -will get MRI-brain     DVT ppx: SCD  Code Status: DNR per pt and her daughter  Family Communication: Yes, patient's daughter at bed side.   Disposition Plan:  Anticipate discharge back to previous environment  Consults called: Dr. Joice Lofts of Ortho and Dr. Darrold Junker of cardiology are consulted.   Admission status and Level of care: Telemetry Medical:   as inpt       Dispo: The patient is from: Home              Anticipated d/c is to:  will need rehab              Anticipated d/c date is: 2 days              Patient currently is not medically stable to d/c.    Severity of  Illness:  The appropriate patient status for this patient is INPATIENT. Inpatient status is judged to be reasonable and necessary in order to provide the required intensity of service to ensure the patient's safety. The patient's presenting symptoms, physical exam findings, and initial radiographic and laboratory data in the context of their chronic comorbidities is felt to place them at high risk for further clinical deterioration. Furthermore, it is not anticipated that the patient will be medically stable for discharge from the hospital within 2 midnights of admission.   * I certify that at the point of admission it is my clinical judgment that the patient will require inpatient hospital care spanning beyond 2 midnights from the point of admission due to high intensity of service, high risk for further deterioration and high frequency of surveillance required.*       Date of Service 07/19/2023    Lorretta Harp [facility]   If 7PM-7AM, please contact night-coverage [email or web address] 07/19/2023, 2:49 PM

## 2023-07-19 NOTE — Consult Note (Signed)
ORTHOPAEDIC CONSULTATION  REQUESTING PHYSICIAN: Lorretta Harp, MD  Chief Complaint:   Left hip pain.  History of Present Illness: Kelsey Martin is a 89 y.o. female with a history of hypertension who lives with her daughter and son-in-law.  The patient normally ambulates within the house without any assistive devices.  The patient was in her usual state of health until last evening when she apparently lost her balance and fell onto her left side.  Because of continued pain in her left hip with resultant inability to bear weight, she was brought to the emergency room this morning.  X-rays in the emergency room demonstrated the presence of a displaced left femoral neck fracture.  The patient denies any associated injuries.  She did not strike her head or lose consciousness.  The patient also denies any lightheadedness, dizziness, chest pain, shortness of breath, or other symptoms which may have precipitated her fall.  Past Medical History:  Diagnosis Date   Abdominal hernia    Hypertension    Past Surgical History:  Procedure Laterality Date   EXPLORATORY LAPAROTOMY     Social History   Socioeconomic History   Marital status: Widowed    Spouse name: Not on file   Number of children: Not on file   Years of education: Not on file   Highest education level: Not on file  Occupational History   Not on file  Tobacco Use   Smoking status: Former   Smokeless tobacco: Never  Substance and Sexual Activity   Alcohol use: Never   Drug use: Not on file   Sexual activity: Not on file  Other Topics Concern   Not on file  Social History Narrative   Not on file   Social Determinants of Health   Financial Resource Strain: Not on file  Food Insecurity: Not on file  Transportation Needs: Not on file  Physical Activity: Not on file  Stress: Not on file  Social Connections: Not on file   Family History  Problem Relation Age of  Onset   Breast cancer Neg Hx    Allergies  Allergen Reactions   Prozac [Fluoxetine] Other (See Comments)    Severe lethargy - seems to apply to all antidepressants    Amlodipine Swelling   Prior to Admission medications   Medication Sig Start Date End Date Taking? Authorizing Provider  ALPRAZolam Prudy Feeler) 0.5 MG tablet Take 0.5 mg by mouth at bedtime as needed for sleep. 04/19/19   [provider]  aspirin EC 81 MG tablet Take 81 mg by mouth daily.    [provider]  atorvastatin (LIPITOR) 20 MG tablet Take 20 mg by mouth daily. 03/14/19   [provider]  benazepril (LOTENSIN) 20 MG tablet Take 20 mg by mouth 2 (two) times a day. 03/19/19   [provider]  bimatoprost (LUMIGAN) 0.01 % SOLN Place 1 drop into both eyes at bedtime.    [provider]  brimonidine (ALPHAGAN) 0.2 % ophthalmic solution Place 1 drop into the left eye at bedtime.  03/15/19   [provider]  dorzolamide-timolol (COSOPT) 22.3-6.8 MG/ML ophthalmic solution Place 1 drop into both eyes 2 (two) times a day.    [provider]  hydrALAZINE (APRESOLINE) 50 MG tablet Take 1 tablet (50 mg total) by mouth 4 (four) times daily. 07/03/19   Emily Filbert, MD  HYDROcodone-acetaminophen (NORCO/VICODIN) 5-325 MG tablet Take 1 tablet by mouth every 6 (six) hours as needed for pain. 05/28/19   [provider]  isosorbide mononitrate (IMDUR) 60 MG 24 hr tablet Take 60 mg by mouth daily.    [provider]  niacin 500 MG tablet Take 500 mg by mouth daily.    [provider]  ondansetron (ZOFRAN-ODT) 4 MG disintegrating tablet Take 4 mg by mouth every 8 (eight) hours as needed for nausea or vomiting.    [provider]   CT HEAD WO CONTRAST ( )  Result Date: 07/19/2023 CLINICAL DATA:  Head trauma, minor (Age >= 65y) EXAM: CT HEAD WITHOUT CONTRAST TECHNIQUE: Contiguous axial images were obtained from the base of the skull through the  vertex without intravenous contrast. RADIATION DOSE REDUCTION: This exam was performed according to the departmental dose-optimization program which includes automated exposure control, adjustment of the mA and/or kV according to patient size and/or use of iterative reconstruction technique. COMPARISON:  Head CT 07/03/2019. FINDINGS: Brain: Compared to prior exam there is an age indeterminate, but possibly acute infarct in the right frontoparietal region. 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 No extra-axial fluid collection. No hydrocephalus. No mass effect. No mass lesion Vascular: No hyperdense vessel or unexpected calcification. Skull: Normal. Negative for fracture or focal lesion. Sinuses/Orbits: No middle ear or mastoid effusion. Paranasal sinuses are clear. Orbits are notable for bilateral lens replacement, otherwise unremarkable. Other: None. IMPRESSION: 1.  No CT evidence of intracranial injury 2. Compared to prior exam there is an age indeterminate, but possibly acute infarct in the right frontoparietal region. Recommend brain MRI for further evaluation. Electronically Signed   By: Lorenza Cambridge M.D.   On: 07/19/2023 12:42   DG Chest Port 1 View  Result Date: 07/19/2023 CLINICAL DATA:  Hip fracture EXAM: PORTABLE CHEST 1 VIEW COMPARISON:  08/01/2019 FINDINGS: Hyperinflation. Enlarged cardiopericardial silhouette. Calcified aorta. Apical pleural thickening. No consolidation, pneumothorax or effusion. No edema. Overlapping cardiac leads. Hiatal hernia. The extreme inferior costophrenic angles are clipped off the edge of the film. IMPRESSION: Hyperinflation. Enlarged heart. Hiatal hernia. No consolidation or effusion. Electronically Signed   By: Karen Kays M.D.   On: 07/19/2023 11:06   DG Hip Unilat W or Wo Pelvis 2-3 Views Left  Result Date: 07/19/2023 CLINICAL DATA:  Hip fracture EXAM: DG HIP (WITH OR WITHOUT PELVIS) 3V LEFT COMPARISON:  None Available.  FINDINGS: Osteopenia. Slightly displaced subcapital left femoral neck fracture. No additional fracture or dislocation. Preserved joint spaces. Scattered vascular calcifications identified. There is bony fusion across the pubic symphysis. Overlapping cardiac leads. Note is made of diffuse colonic stool. Degenerative changes of the visualized portions of the lower lumbar spine. IMPRESSION: Severe osteopenia.  Left-sided subcapital femoral neck fracture. Electronically Signed   By: Karen Kays M.D.   On: 07/19/2023 10:43    Positive ROS: All other systems have been reviewed and were otherwise negative with the exception of those mentioned in the HPI and as above.  Physical Exam: General:  Alert, no acute distress Psychiatric:  Patient is competent for consent with normal mood and affect   Cardiovascular:  No pedal edema Respiratory:  No wheezing, non-labored breathing GI:  Abdomen is soft and non-tender Skin:  No lesions in the area of chief complaint Neurologic:  Sensation intact distally Lymphatic:  No axillary or cervical lymphadenopathy  Orthopedic Exam:  Orthopedic examination is limited to the left hip and lower extremity.  The left leg is somewhat shortened and externally rotated as compared to the right.  Skin inspection around the left hip is unremarkable.  No swelling, erythema, ecchymosis, abrasions, or other skin abnormalities are identified.  She has mild tenderness to palpation over the lateral aspect of the hip.  She has more severe pain with any attempted active or passive motion of the hip.  She is grossly neurovascularly intact to the left lower extremity and foot.  X-rays:  X-rays of the pelvis and left hip are available for review and have been reviewed by myself.  These films demonstrate the presence of a displaced left femoral neck fracture.  No significant degenerative changes of the hip joint are noted.  No lytic lesions or other acute bony abnormalities are  identified.  Assessment: Displaced left femoral neck fracture.  Plan: The treatment options, including both surgical and nonsurgical choices, have been discussed in detail with the patient and her daughter.  The patient would like to proceed with surgical intervention to include a left hip hemiarthroplasty.  The risks (including bleeding, infection, nerve and/or blood vessel injury, persistent or recurrent pain, loosening or failure of the components, leg length inequality, dislocation, need for further surgery, blood clots, strokes, heart attacks or arrhythmias, pneumonia, etc.) and benefits of the surgical procedure were discussed.  The patient and her daughter state their understanding and agree to proceed.  A formal written consent will be obtained by the nursing staff.  Thank you for asking me to participate in the care of this most pleasant yet unfortunate woman.  I will be happy to follow her with you.   Maryagnes Amos, MD  Beeper #:  [phone]  07/19/2023 10:43 AM

## 2023-07-20 ENCOUNTER — Encounter: Payer: Self-pay | Admitting: Surgery

## 2023-07-20 DIAGNOSIS — S72002A Fracture of unspecified part of neck of left femur, initial encounter for closed fracture: Secondary | ICD-10-CM | POA: Diagnosis not present

## 2023-07-20 LAB — MAGNESIUM: Magnesium: 1.6 mg/dL — ABNORMAL LOW (ref 1.7–2.4)

## 2023-07-20 MED ORDER — BENAZEPRIL HCL 20 MG PO TABS
20.0000 mg | ORAL_TABLET | Freq: Every day | ORAL | Status: DC
  Administered 2023-07-20 – 2023-07-22 (×3): 20 mg via ORAL
  Filled 2023-07-20 (×3): qty 1

## 2023-07-20 MED ORDER — MAGNESIUM SULFATE 2 GM/50ML IV SOLN
2.0000 g | Freq: Once | INTRAVENOUS | Status: AC
  Administered 2023-07-20: 2 g via INTRAVENOUS
  Filled 2023-07-20: qty 50

## 2023-07-20 MED ORDER — POTASSIUM CHLORIDE CRYS ER 20 MEQ PO TBCR
40.0000 meq | EXTENDED_RELEASE_TABLET | Freq: Once | ORAL | Status: AC
  Administered 2023-07-20: 40 meq via ORAL
  Filled 2023-07-20: qty 2

## 2023-07-20 NOTE — Progress Notes (Signed)
  Subjective: 1 Day Post-Op Procedure(s) (LRB): ARTHROPLASTY BIPOLAR HIP (HEMIARTHROPLASTY) (Left) Patient reports pain as mild.   Patient is well, and has had no acute complaints or problems PT and care management to assist with discharge planning. Negative for chest pain and shortness of breath Fever: no Gastrointestinal:Negative for nausea and vomiting  Objective: Vital signs in last 24 hours: Temp:  [97 F (36.1 C)-98.2 F (36.8 C)] 97.5 F (36.4 C) (07/31 0433) Pulse Rate:  [46-88] 73 (07/31 0433) Resp:  [15-24] 20 (07/31 0433) BP: (102-215)/(57-188) 125/70 (07/31 0433) SpO2:  [95 %-100 %] 96 % (07/31 0433) Weight:  [59.9 kg] 59.9 kg (07/30 1341)  Intake/Output from previous day:  Intake/Output Summary (Last 24 hours) at 07/20/2023 0758 Last data filed at 07/20/2023 0640 Gross per 24 hour  Intake 1362.5 ml  Output 810 ml  Net 552.5 ml    Intake/Output this shift: No intake/output data recorded.  Labs: Recent Labs    07/19/23 0957 07/20/23 0537  HGB 12.5 10.5*   Recent Labs    07/19/23 0957 07/20/23 0537  WBC 10.6* 13.6*  RBC 3.83* 3.24*  HCT 35.1* 29.2*  PLT 240 241   Recent Labs    07/19/23 2302 07/20/23 0716  NA 127* 129*  K 3.2* 3.5  CL 94* 97*  CO2 22 21*  BUN 11 16  CREATININE 0.81 0.85  GLUCOSE 220* 140*  CALCIUM 9.2 9.3   Recent Labs    07/19/23 1132  INR 1.1     EXAM General - Patient is Alert and Appropriate.  Daughter at bedside. Extremity - ABD soft Neurovascular intact Dorsiflexion/Plantar flexion intact Incision: dressing C/D/I No cellulitis present Compartment soft Dressing/Incision - clean, dry, no drainage noted to the left hip honeycomb dressing. Motor Function - intact, moving foot and toes well on exam.  ABdomen soft with intact bowel sounds this morning.  Past Medical History:  Diagnosis Date   Abdominal hernia    Anxiety    CAD (coronary artery disease)    HLD (hyperlipidemia)    Hypertension      Assessment/Plan: 1 Day Post-Op Procedure(s) (LRB): ARTHROPLASTY BIPOLAR HIP (HEMIARTHROPLASTY) (Left) Principal Problem:   Closed fracture of left hip (HCC) Active Problems:   Chronic hyponatremia   CAD (coronary artery disease)   Hypertension   Fall at home, initial encounter   New onset atrial fibrillation (HCC)   Diarrhea   Hypertensive urgency  Estimated body mass index is 21.97 kg/m as calculated from the following:   Height as of this encounter: 5\' 5"  (1.651 m).   Weight as of this encounter: 59.9 kg. Advance diet Up with therapy D/C IV fluids when tolerating po intake.  Labs reviewed this AM. WBC 12.6, Hg 10.5 MRI of the brain negative for acute findings. Up with therapy today.  She lives at home with family.  Hopeful she can return home. She may need short term SNF placement.  DVT Prophylaxis - TED hose and Eliquis Weight-Bearing as tolerated to left leg  J. Horris Latino, PA-C [facility] 07/20/2023, 7:58 AM

## 2023-07-20 NOTE — Evaluation (Signed)
Physical Therapy Evaluation Patient Details Name: Kelsey Martin MRN: [id number] DOB: 1930-02-12 Today's Date: 07/20/2023  History of Present Illness  89 y/o female s/p fall at home.  Suffered L hip fracture and is now s/p total hip replacement (posterior approach) 7/30.  Clinical Impression  Pt pleasant and motivated but ultimately very weak and limited in the L LE with unsafe standing tolerance/consistent L LE buckling and need for heavy assist to maintain safety/standing.  She showed good effort with supin exercises but baseline knee posture as well as weakness necessitated constant cuing, guidance and a lot of AAROM help to perform.  Pt will benefit from continued PT to address functional limitations per total hip protocols.        If plan is discharge home, recommend the following: Two people to help with walking and/or transfers;Two people to help with bathing/dressing/bathroom;Assistance with cooking/housework;Assist for transportation;Help with stairs or ramp for entrance   Can travel by private vehicle   No    Equipment Recommendations  (TBD at rehab)  Recommendations for Other Services       Functional Status Assessment Patient has had a recent decline in their functional status and demonstrates the ability to make significant improvements in function in a reasonable and predictable amount of time.     Precautions / Restrictions Precautions Precautions: Posterior Hip;Fall Restrictions Weight Bearing Restrictions: Yes RLE Weight Bearing: Weight bearing as tolerated      Mobility  Bed Mobility Overal bed mobility: Needs Assistance Bed Mobility: Supine to Sit     Supine to sit: Mod assist     General bed mobility comments: Pt able to initiate some movement toward EOB but needed considerable assist to actually attain sitting    Transfers Overall transfer level: Needs assistance Equipment used: Rolling walker (2 wheels) Transfers: Sit to/from Stand Sit to Stand:  From elevated surface, Max assist           General transfer comment: Attempted from low bed setting minA unsuccessfully, then with minA from ~3" elevation without success.  Ultimately needed maxA from elevated surface to attain standing with heavy walker reliance and poor confidence, L knee buckling and lacking good control    Ambulation/Gait Ambulation/Gait assistance: Max assist, Total assist Gait Distance (Feet): 3 Feet Assistive device: Rolling walker (2 wheels)         General Gait Details: bed to recliner with very poor tolerance, balance.  L knee consistently buckling needing direct assist to keep from buckling during WBing.  Struggled with keeping weight over walker and hips forward, poor ability to maintain upright and effectively weight shift/step.  Stairs            Wheelchair Mobility     Tilt Bed    Modified Rankin (Stroke Patients Only)       Balance Overall balance assessment: Needs assistance Sitting-balance support: Single extremity supported Sitting balance-Leahy Scale: Good     Standing balance support: Bilateral upper extremity supported Standing balance-Leahy Scale: Zero Standing balance comment: buckling and unsteady with standing attempts in walker                             Pertinent Vitals/Pain Pain Assessment Pain Assessment: 0-10 Pain Score: 2  Pain Location: pt reports very little pain during activity, none at rest    Home Living Family/patient expects to be discharged to:: Skilled nursing facility Living Arrangements: Children  Additional Comments: 3 steps with enter with L rail, has walker (FWW?), with daughter/S-I-L    Prior Function Prior Level of Function : Independent/Modified Independent             Mobility Comments: apparently she was up ad lib in the home w/o AD, but funiture cruising and has had 2 other recent falls       Hand Dominance        Extremity/Trunk  Assessment   Upper Extremity Assessment Upper Extremity Assessment: Generalized weakness (age appropriate limitations)    Lower Extremity Assessment Lower Extremity Assessment: Generalized weakness (R LE grossly 4-/5, L LE grossly 3-/5.  L knee OA and genu valgus aparent.)       Communication   Communication: No difficulties  Cognition Arousal/Alertness: Awake/alert Behavior During Therapy: WFL for tasks assessed/performed Overall Cognitive Status: Within Functional Limits for tasks assessed                                          General Comments General comments (skin integrity, edema, etc.): Pt highly motivated but functionally limited    Exercises Total Joint Exercises Ankle Circles/Pumps: AROM, 10 reps Quad Sets: Strengthening, 10 reps Short Arc Quad: AAROM, 10 reps Heel Slides: AAROM, Strengthening, 10 reps (lightly resisted leg ext) Hip ABduction/ADduction: AROM, 10 reps   Assessment/Plan    PT Assessment Patient needs continued PT services  PT Problem List Decreased strength;Decreased range of motion;Decreased activity tolerance;Decreased mobility;Decreased balance;Decreased knowledge of use of DME;Decreased safety awareness;Decreased coordination;Decreased knowledge of precautions;Pain       PT Treatment Interventions DME instruction;Gait training;Functional mobility training;Therapeutic exercise;Therapeutic activities;Balance training;Neuromuscular re-education;Cognitive remediation;Patient/family education    PT Goals (Current goals can be found in the Care Plan section)  Acute Rehab PT Goals Patient Stated Goal: pt very much wants to d/c home if possible PT Goal Formulation: With patient/family Time For Goal Achievement: 08/02/23 Potential to Achieve Goals: Good    Frequency BID     Co-evaluation               AM-PAC PT "6 Clicks" Mobility  Outcome Measure Help needed turning from your back to your side while in a flat bed  without using bedrails?: A Lot Help needed moving from lying on your back to sitting on the side of a flat bed without using bedrails?: A Lot Help needed moving to and from a bed to a chair (including a wheelchair)?: Total Help needed standing up from a chair using your arms (e.g., wheelchair or bedside chair)?: Total Help needed to walk in hospital room?: Total Help needed climbing 3-5 steps with a railing? : Total 6 Click Score: 8    End of Session Equipment Utilized During Treatment: Gait belt Activity Tolerance: Patient limited by fatigue Patient left: with call bell/phone within reach;with chair alarm set;with family/visitor present Nurse Communication: Mobility status PT Visit Diagnosis: Muscle weakness (generalized) (M62.81);Difficulty in walking, not elsewhere classified (R26.2);Pain Pain - Right/Left: Left Pain - part of body: Hip    Time: 0911-1009 PT Time Calculation (min) (ACUTE ONLY): 58 min   Charges:   PT Evaluation $PT Eval Low Complexity: 1 Low PT Treatments $Gait Training: 8-22 mins $Therapeutic Exercise: 8-22 mins $Therapeutic Activity: 8-22 mins PT General Charges $$ ACUTE PT VISIT: 1 Visit         Malachi Pro, DPT 07/20/2023, 11:13 AM

## 2023-07-20 NOTE — Plan of Care (Signed)

## 2023-07-20 NOTE — Plan of Care (Signed)
  Problem: Education: Goal: Knowledge of General Education information will improve Description: Including pain rating scale, medication(s)/side effects and non-pharmacologic comfort measures 07/20/2023 2208 by Pamala Duffel, RN Outcome: Progressing 07/20/2023 2207 by Pamala Duffel, RN Outcome: Progressing   Problem: Health Behavior/Discharge Planning: Goal: Ability to manage health-related needs will improve 07/20/2023 2208 by Pamala Duffel, RN Outcome: Progressing 07/20/2023 2207 by Pamala Duffel, RN Outcome: Progressing   Problem: Clinical Measurements: Goal: Ability to maintain clinical measurements within normal limits will improve 07/20/2023 2208 by Pamala Duffel, RN Outcome: Progressing 07/20/2023 2207 by Pamala Duffel, RN Outcome: Progressing Goal: Will remain free from infection 07/20/2023 2208 by Pamala Duffel, RN Outcome: Progressing 07/20/2023 2207 by Pamala Duffel, RN Outcome: Progressing Goal: Diagnostic test results will improve 07/20/2023 2208 by Pamala Duffel, RN Outcome: Progressing 07/20/2023 2207 by Pamala Duffel, RN Outcome: Progressing Goal: Respiratory complications will improve 07/20/2023 2208 by Pamala Duffel, RN Outcome: Progressing 07/20/2023 2207 by Pamala Duffel, RN Outcome: Progressing Goal: Cardiovascular complication will be avoided 07/20/2023 2208 by Pamala Duffel, RN Outcome: Progressing 07/20/2023 2207 by Pamala Duffel, RN Outcome: Progressing   Problem: Activity: Goal: Risk for activity intolerance will decrease 07/20/2023 2208 by Pamala Duffel, RN Outcome: Progressing 07/20/2023 2207 by Pamala Duffel, RN Outcome: Progressing   Problem: Nutrition: Goal: Adequate nutrition will be maintained 07/20/2023 2208 by Pamala Duffel, RN Outcome: Progressing 07/20/2023 2207 by Pamala Duffel, RN Outcome: Progressing   Problem:  Coping: Goal: Level of anxiety will decrease 07/20/2023 2208 by Pamala Duffel, RN Outcome: Progressing 07/20/2023 2207 by Pamala Duffel, RN Outcome: Progressing   Problem: Elimination: Goal: Will not experience complications related to bowel motility 07/20/2023 2208 by Pamala Duffel, RN Outcome: Progressing 07/20/2023 2207 by Pamala Duffel, RN Outcome: Progressing Goal: Will not experience complications related to urinary retention 07/20/2023 2208 by Pamala Duffel, RN Outcome: Progressing 07/20/2023 2207 by Pamala Duffel, RN Outcome: Progressing   Problem: Pain Managment: Goal: General experience of comfort will improve 07/20/2023 2208 by Pamala Duffel, RN Outcome: Progressing 07/20/2023 2207 by Pamala Duffel, RN Outcome: Progressing   Problem: Safety: Goal: Ability to remain free from injury will improve 07/20/2023 2208 by Pamala Duffel, RN Outcome: Progressing 07/20/2023 2207 by Pamala Duffel, RN Outcome: Progressing   Problem: Skin Integrity: Goal: Risk for impaired skin integrity will decrease 07/20/2023 2208 by Pamala Duffel, RN Outcome: Progressing 07/20/2023 2207 by Pamala Duffel, RN Outcome: Progressing

## 2023-07-20 NOTE — Progress Notes (Signed)
Progress Note    Kelsey Martin  XBM:[id number] DOB: 05-22-1930  DOA: 07/19/2023 PCP: Marguarite Arbour, MD      Brief Narrative:    Medical records reviewed and are as summarized below:  Kelsey Martin is a 89 y.o. female  with medical history significant of CAD, HLD, HTN, anxiety, chronic hyponatremia (120-130), who presented to the hospital with left hip pain following a fall.  Reportedly, she had had multiple falls at home.  She was found to have displaced left femoral neck fracture.  She underwent left hip bipolar hemiarthroplasty on 07/19/2023.  She was treated with analgesics.      Assessment/Plan:   Principal Problem:   Closed fracture of left hip [facility]) Active Problems:   Fall at home, initial encounter   New onset atrial fibrillation (HCC)   CAD (coronary artery disease)   Hypertension   Hypertensive urgency   Chronic hyponatremia   Diarrhea   Nutrition Problem: Increased nutrient needs Etiology: hip fracture  Signs/Symptoms: estimated needs   Body mass index is 21.97 kg/m.   Close left hip (femoral neck) fracture, s/p fall at home: S/p left hip bipolar hemiarthroplasty on 07/19/2023.  Continue analgesics as needed for pain.  Follow-up with the surgeon.  PT recommended discharge to SNF.   Atrial fibrillation, new onset: Continue bisoprolol and Eliquis. CHA2DS2-VASc score is 6. 2D echo showed EF estimated at 60 to 65%, indeterminate LV diastolic parameters.   Hypomagnesemia: Replete magnesium with IV magnesium sulfate. Hypokalemia: Improving.  Continue potassium repletion.   CAD: Continue Eliquis.  Stop aspirin.   Hypertension: Restart Lotensin.  Continue hydralazine, bisoprolol and clonidine patch.   Chronic hyponatremia: Asymptomatic  Diet Order             Diet Heart Room service appropriate? Yes; Fluid consistency: Thin  Diet effective now                            Consultants: Orthopedic  surgeon Cardiologist  Procedures: Left hip bipolar hemiarthroplasty    Medications:    apixaban  2.5 mg Oral BID   aspirin EC  81 mg Oral Daily   atorvastatin  20 mg Oral QHS   bisoprolol  5 mg Oral Daily   brimonidine  1 drop Left Eye QHS   [START ON 07/26/2023] cloNIDine  0.2 mg Transdermal Weekly   docusate sodium  100 mg Oral BID   dorzolamide-timolol  1 drop Both Eyes BID   feeding supplement  237 mL Oral BID BM   hydrALAZINE  50 mg Oral QID   isosorbide mononitrate  60 mg Oral Daily   latanoprost  1 drop Both Eyes QHS   multivitamin with minerals  1 tablet Oral Daily   Continuous Infusions:  sodium chloride 75 mL/hr at 07/19/23 1437   sodium chloride 75 mL/hr at 07/20/23 0348    ceFAZolin (ANCEF) IV 2 g (07/20/23 1314)   magnesium sulfate bolus IVPB 2 g (07/20/23 1242)     Anti-infectives (From admission, onward)    Start     Dose/Rate Route Frequency Ordered Stop   07/19/23 2000  ceFAZolin (ANCEF) IVPB 2g/100 mL premix        2 g 200 mL/hr over 30 Minutes Intravenous Every 8 hours 07/19/23 1900 07/20/23 1959   07/19/23 1300  ceFAZolin (ANCEF) IVPB 2g/100 mL premix        2 g 200 mL/hr over 30 Minutes Intravenous 30  min pre-op 07/19/23 1046 07/20/23 0853              Family Communication/Anticipated D/C date and plan/Code Status   DVT prophylaxis: apixaban (ELIQUIS) tablet 2.5 mg Start: 07/20/23 1000 SCDs Start: 07/19/23 1901 Place TED hose Start: 07/19/23 1901 apixaban (ELIQUIS) tablet 2.5 mg     Code Status: DNR  Family Communication: Plan discussed with the husband at the bedside Disposition Plan: Plan to discharge to SNF   Status is: Inpatient Remains inpatient appropriate because: S/p left hip surgery       Subjective:   Interval events noted.  No chest pain, shortness of breath, vomiting or diarrhea.  Left hip pain is better.  She was working with [place] Callas, PT, when I walked in to her room.  Her husband was at the  bedside.  Objective:    Vitals:   07/20/23 0115 07/20/23 0433 07/20/23 0832 07/20/23 1131  BP: (!) 102/57 125/70 (!) 157/91 (!) 144/85  Pulse: 80 73 72 (!) 58  Resp: 20 20 16 18   Temp: (!) 97.5 F (36.4 C) (!) 97.5 F (36.4 C) (!) 97.5 F (36.4 C) (!) 97.4 F (36.3 C)  TempSrc:    Oral  SpO2: 96% 96% 97% 98%  Weight:      Height:       No data found.   Intake/Output Summary (Last 24 hours) at 07/20/2023 1331 Last data filed at 07/20/2023 1300 Gross per 24 hour  Intake 1362.5 ml  Output 1510 ml  Net -147.5 ml   Filed Weights   07/19/23 1341  Weight: 59.9 kg    Exam:  GEN: NAD SKIN: Warm and dry EYES: No pallor or icterus ENT: MMM CV: Irregular rate and rhythm PULM: CTA B ABD: soft, ND, NT, +BS CNS: AAO x 3, non focal EXT: No edema or tenderness.  Dressing on left hip surgical wound is clean, dry and intact.        Data Reviewed:   I have personally reviewed following labs and imaging studies:  Labs: Labs show the following:   Basic Metabolic Panel: Recent Labs  Lab 07/19/23 0957 07/19/23 1233 07/19/23 2302 07/20/23 0537 07/20/23 0716  NA 127* 126* 127*  --  129*  K 3.5 3.5 3.2*  --  3.5  CL 93* 93* 94*  --  97*  CO2 23 24 22   --  21*  GLUCOSE 115* 119* 220*  --  140*  BUN 9 9 11   --  16  CREATININE 0.56 0.69 0.81  --  0.85  CALCIUM 9.7 9.6 9.2  --  9.3  MG  --   --   --  1.6*  --    GFR Estimated Creatinine Clearance: 39.6 mL/min (by C-G formula based on SCr of 0.85 mg/dL). Liver Function Tests: Recent Labs  Lab 07/19/23 0957  AST 25  ALT 16  ALKPHOS 49  BILITOT 1.2  PROT 6.8  ALBUMIN 3.9   No results for input(s): "LIPASE", "AMYLASE" in the last 168 hours. No results for input(s): "AMMONIA" in the last 168 hours. Coagulation profile Recent Labs  Lab 07/19/23 1132  INR 1.1    CBC: Recent Labs  Lab 07/19/23 0957 07/20/23 0537  WBC 10.6* 13.6*  HGB 12.5 10.5*  HCT 35.1* 29.2*  MCV 91.6 90.1  PLT 240 241   Cardiac  Enzymes: Recent Labs  Lab 07/19/23 0957  CKTOTAL 43   BNP (last 3 results) No results for input(s): "PROBNP" in the last 8760 hours.  CBG: No results for input(s): "GLUCAP" in the last 168 hours. D-Dimer: No results for input(s): "DDIMER" in the last 72 hours. Hgb A1c: No results for input(s): "HGBA1C" in the last 72 hours. Lipid Profile: No results for input(s): "CHOL", "HDL", "LDLCALC", "TRIG", "CHOLHDL", "LDLDIRECT" in the last 72 hours. Thyroid function studies: Recent Labs    07/19/23 1233  TSH 0.899   Anemia work up: No results for input(s): "VITAMINB12", "FOLATE", "FERRITIN", "TIBC", "IRON", "RETICCTPCT" in the last 72 hours. Sepsis Labs: Recent Labs  Lab 07/19/23 0957 07/20/23 0537  WBC 10.6* 13.6*    Microbiology Recent Results (from the past 240 hour(s))  Surgical PCR screen     Status: None   Collection Time: 07/19/23  9:57 AM   Specimen: Nasal Mucosa; Nasal Swab  Result Value Ref Range Status   MRSA, PCR NEGATIVE NEGATIVE Final   Staphylococcus aureus NEGATIVE NEGATIVE Final    Comment: (NOTE) The Xpert SA Assay (FDA approved for NASAL specimens in patients 65 years of age and older), is one component of a comprehensive surveillance program. It is not intended to diagnose infection nor to guide or monitor treatment. Performed at [facility], [address] [place], [place] [address]     Procedures and diagnostic studies:  ECHOCARDIOGRAM COMPLETE  Result Date: 07/20/2023    ECHOCARDIOGRAM REPORT   Patient Name:   MEDLEY MIKKELSEN Date of Exam: 07/19/2023 Medical Rec #:  [id number]      Height:       65.0 in Accession #:    [id number]     Weight:       132.0 lb Date of Birth:  Jan 28, 1930      BSA:          1.658 m Patient Age:    89 years       BP:           215/188 mmHg Patient Gender: F              HR:           60 bpm. Exam Location:  [facility] Procedure: 2D Echo, Cardiac Doppler and Color Doppler Indications:     I48.91 Atrial Fibrillation   History:         Patient has no prior history of Echocardiogram examinations.                  CAD; Risk Factors:Hypertension and Dyslipidemia.  Sonographer:     Daphine Deutscher RDCS Referring Phys:  [id number] Cheryln Manly TANG Diagnosing Phys: Marcina Millard MD IMPRESSIONS  1. Left ventricular ejection fraction, by estimation, is 60 to 65%. The left ventricle has normal function. The left ventricle has no regional wall motion abnormalities. Indeterminate diastolic filling due to E-A fusion.  2. Right ventricular systolic function is normal. The right ventricular size is normal.  3. The mitral valve is normal in structure. No evidence of mitral valve regurgitation. No evidence of mitral stenosis.  4. The aortic valve is normal in structure. Aortic valve regurgitation is trivial. No aortic stenosis is present.  5. The inferior vena cava is normal in size with greater than 50% respiratory variability, suggesting right atrial pressure of 3 mmHg. FINDINGS  Left Ventricle: Left ventricular ejection fraction, by estimation, is 60 to 65%. The left ventricle has normal function. The left ventricle has no regional wall motion abnormalities. The left ventricular internal cavity size was normal in size. There is  no left ventricular hypertrophy. Indeterminate diastolic filling due  to E-A fusion. Right Ventricle: The right ventricular size is normal. No increase in right ventricular wall thickness. Right ventricular systolic function is normal. Left Atrium: Left atrial size was normal in size. Right Atrium: Right atrial size was normal in size. Pericardium: There is no evidence of pericardial effusion. Mitral Valve: The mitral valve is normal in structure. No evidence of mitral valve regurgitation. No evidence of mitral valve stenosis. Tricuspid Valve: The tricuspid valve is normal in structure. Tricuspid valve regurgitation is not demonstrated. No evidence of tricuspid stenosis. Aortic Valve: The aortic valve is  normal in structure. Aortic valve regurgitation is trivial. No aortic stenosis is present. Pulmonic Valve: The pulmonic valve was normal in structure. Pulmonic valve regurgitation is not visualized. No evidence of pulmonic stenosis. Aorta: The aortic root is normal in size and structure. Venous: The inferior vena cava is normal in size with greater than 50% respiratory variability, suggesting right atrial pressure of 3 mmHg. IAS/Shunts: No atrial level shunt detected by color flow Doppler.  LEFT VENTRICLE PLAX 2D LVIDd:         3.80 cm LVIDs:         2.60 cm LV PW:         1.10 cm LV IVS:        1.10 cm  RIGHT VENTRICLE             IVC RV Basal diam:  3.70 cm     IVC diam: 1.20 cm RV S prime:     11.40 cm/s TAPSE (M-mode): 1.7 cm LEFT ATRIUM             Index        RIGHT ATRIUM           Index LA diam:        3.80 cm 2.29 cm/m   RA Area:     14.50 cm LA Vol (A2C):   40.1 ml 24.19 ml/m  RA Volume:   35.70 ml  21.53 ml/m LA Vol (A4C):   53.4 ml 32.21 ml/m LA Biplane Vol: 47.3 ml 28.53 ml/m  AORTIC VALVE LVOT Vmax:   92.55 cm/s LVOT Vmean:  59.200 cm/s LVOT VTI:    0.170 m  AORTA Ao Root diam: 3.20 cm MV E velocity: 102.35 cm/s                             SHUNTS                             Systemic VTI: 0.17 m Marcina Millard MD Electronically signed by Marcina Millard MD Signature Date/Time: 07/20/2023/7:39:50 AM    Final    MR BRAIN WO CONTRAST  Result Date: 07/20/2023 CLINICAL DATA:  Initial evaluation for neuro deficit, stroke suspected. EXAM: MRI HEAD WITHOUT CONTRAST TECHNIQUE: Multiplanar, multiecho pulse sequences of the brain and surrounding structures were obtained without intravenous contrast. COMPARISON:  Prior study from earlier the same day. FINDINGS: Brain: Diffuse prominence of the CSF containing spaces compatible generalized age-related cerebral atrophy. Chronic infarct noted at the right parietal lobe. Few small remote lacunar infarcts noted about the right basal ganglia and  bilateral thalami. Few tiny remote bilateral cerebellar infarcts noted. Underlying mild chronic microvascular ischemic disease for age. No evidence for acute or subacute ischemia. Gray-white matter differentiation maintained. No acute intracranial hemorrhage. Few punctate chronic micro hemorrhages noted, likely small vessel related. No mass  lesion, midline shift or mass effect. No hydrocephalus or extra-axial fluid collection. Pituitary gland is suprasellar region within normal limits. Vascular: Major intracranial vascular flow voids are maintained. Skull and upper cervical spine: Craniocervical junction within normal limits. Bone marrow signal intensity normal. No scalp soft tissue abnormality. Sinuses/Orbits: Prior bilateral ocular lens replacement. Paranasal sinuses are clear. No significant mastoid effusion. Other: None. IMPRESSION: 1. No acute intracranial abnormality. 2. Chronic right parietal infarct, with a few additional remote lacunar infarcts involving the right basal ganglia, bilateral thalami, and cerebellum. 3. Underlying age-related cerebral atrophy with mild chronic small vessel ischemic disease. Electronically Signed   By: Rise Mu M.D.   On: 07/20/2023 04:18   DG HIP UNILAT W OR W/O PELVIS 2-3 VIEWS LEFT  Result Date: 07/19/2023 CLINICAL DATA:  Status post hip hemiarthroplasty. EXAM: DG HIP (WITH OR WITHOUT PELVIS) 2-3V LEFT COMPARISON:  Preoperative imaging FINDINGS: Left hip arthroplasty in expected alignment. No periprosthetic lucency or fracture. Recent postsurgical change includes air and edema in the soft tissues. Lateral skin staples in place. IMPRESSION: Left hip arthroplasty without immediate postoperative complication. Electronically Signed   By: Narda Rutherford M.D.   On: 07/19/2023 18:09   CT HEAD WO CONTRAST ( )  Result Date: 07/19/2023 CLINICAL DATA:  Head trauma, minor (Age >= 65y) EXAM: CT HEAD WITHOUT CONTRAST TECHNIQUE: Contiguous axial images were obtained  from the base of the skull through the vertex without intravenous contrast. RADIATION DOSE REDUCTION: This exam was performed according to the departmental dose-optimization program which includes automated exposure control, adjustment of the mA and/or kV according to patient size and/or use of iterative reconstruction technique. COMPARISON:  Head CT 07/03/2019. FINDINGS: Brain: Compared to prior exam there is an age indeterminate, but possibly acute infarct in the right frontoparietal region. 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 No extra-axial fluid collection. No hydrocephalus. No mass effect. No mass lesion Vascular: No hyperdense vessel or unexpected calcification. Skull: Normal. Negative for fracture or focal lesion. Sinuses/Orbits: No middle ear or mastoid effusion. Paranasal sinuses are clear. Orbits are notable for bilateral lens replacement, otherwise unremarkable. Other: None. IMPRESSION: 1.  No CT evidence of intracranial injury 2. Compared to prior exam there is an age indeterminate, but possibly acute infarct in the right frontoparietal region. Recommend brain MRI for further evaluation. Electronically Signed   By: Lorenza Cambridge M.D.   On: 07/19/2023 12:42   DG Chest Port 1 View  Result Date: 07/19/2023 CLINICAL DATA:  Hip fracture EXAM: PORTABLE CHEST 1 VIEW COMPARISON:  08/01/2019 FINDINGS: Hyperinflation. Enlarged cardiopericardial silhouette. Calcified aorta. Apical pleural thickening. No consolidation, pneumothorax or effusion. No edema. Overlapping cardiac leads. Hiatal hernia. The extreme inferior costophrenic angles are clipped off the edge of the film. IMPRESSION: Hyperinflation. Enlarged heart. Hiatal hernia. No consolidation or effusion. Electronically Signed   By: Karen Kays M.D.   On: 07/19/2023 11:06   DG Hip Unilat W or Wo Pelvis 2-3 Views Left  Result Date: 07/19/2023 CLINICAL DATA:  Hip fracture EXAM: DG HIP (WITH OR WITHOUT PELVIS) 3V  LEFT COMPARISON:  None Available. FINDINGS: Osteopenia. Slightly displaced subcapital left femoral neck fracture. No additional fracture or dislocation. Preserved joint spaces. Scattered vascular calcifications identified. There is bony fusion across the pubic symphysis. Overlapping cardiac leads. Note is made of diffuse colonic stool. Degenerative changes of the visualized portions of the lower lumbar spine. IMPRESSION: Severe osteopenia.  Left-sided subcapital femoral neck fracture. Electronically Signed   By: Karen Kays  M.D.   On: 07/19/2023 10:43               LOS: 1 day   Socrates Cahoon  [facility]   Pager on [email or web address]. If 7PM-7AM, please contact night-coverage at [email or web address]     07/20/2023, 1:31 PM

## 2023-07-20 NOTE — TOC Progression Note (Signed)
Transition of Care [facility]) - Progression Note    Patient Details  Name: Kelsey Martin MRN: [id number] Date of Birth: 21-Feb-1930  Transition of Care [facility]) CM/SW Contact  Marlowe Sax, RN Phone Number: 07/20/2023, 9:47 AM  Clinical Narrative:     TOC continues to follow and will assist with DC planning and needs, awaiting Therapy evaluation and recommendation    Barriers to Discharge: Continued Medical Work up  Expected Discharge Plan and Services     Post Acute Care Choice: Home Health, Skilled Nursing Facility Living arrangements for the past 2 months: Single Family Home                                       Social Determinants of Health (SDOH) Interventions SDOH Screenings   Food Insecurity: No Food Insecurity (07/19/2023)  Housing: Low Risk  (07/19/2023)  Transportation Needs: No Transportation Needs (07/19/2023)  Utilities: Not At Risk (07/19/2023)  Tobacco Use: Medium Risk (07/19/2023)    Readmission Risk Interventions     No data to display

## 2023-07-20 NOTE — Progress Notes (Signed)
Physical Therapy Treatment Patient Details Name: Kelsey Martin MRN: [id number] DOB: 27-May-1930 Today's Date: 07/20/2023   History of Present Illness 89 y/o female s/p fall at home.  Suffered L hip fracture and is now s/p total hip replacement (posterior approach) 7/30.    PT Comments  Pt remains motivated but functionally very limited.  She did better with transition/"gait" chair to bed than with the transfer this AM, but still with intermittent buckling and need for close assist and occasional heavy assist with buckling/backward leaning LOBs.  Pt showed great effort with supine exercises, but remains weak in L quad, lacks AROM TKE and has baseline OA/posturing resulting in an inherently IR LE posture - repeated cuing/reminders for hip precautions.  Pt intermittently showing near/actual breaking of precautions t/o the session.  Daughter present, all questions answered, continue with POC.      If plan is discharge home, recommend the following: Two people to help with walking and/or transfers;Two people to help with bathing/dressing/bathroom;Assistance with cooking/housework;Assist for transportation;Help with stairs or ramp for entrance   Can travel by private vehicle     No  Equipment Recommendations  None recommended by PT    Recommendations for Other Services       Precautions / Restrictions Precautions Precautions: Posterior Hip;Fall Restrictions RLE Weight Bearing: Weight bearing as tolerated     Mobility  Bed Mobility Overal bed mobility: Needs Assistance Bed Mobility: Sit to Supine     Supine to sit: Mod assist, Max assist     General bed mobility comments: Pt hesitant to initiate getting LEs back up into bed, ultimately needed considerable assist to do so    Transfers Overall transfer level: Needs assistance Equipment used: Rolling walker (2 wheels) Transfers: Sit to/from Stand Sit to Stand: Max assist           General transfer comment: heavy assist from  recliner, a lot of cuing for UE use on the arm rests, unable to effectively use L LE to bear weight during transition    Ambulation/Gait Ambulation/Gait assistance: Max assist Gait Distance (Feet): 3 Feet Assistive device: Rolling walker (2 wheels)         General Gait Details: recliner to bed side shuffle steps.  Still with some L knee buckling and postural issues, but was better able to do some actual quad engagement with almost constant cuing/reminders.  Heavy reliance on the walker and slow, deliberate, small steps to turn 90* from recliner to bed   Stairs             Wheelchair Mobility     Tilt Bed    Modified Rankin (Stroke Patients Only)       Balance Overall balance assessment: Needs assistance Sitting-balance support: Single extremity supported Sitting balance-Leahy Scale: Good     Standing balance support: Bilateral upper extremity supported Standing balance-Leahy Scale: Poor Standing balance comment: less buckling/more stability than this AM but still quite weak and unsteady with standing tasks                            Cognition Arousal/Alertness: Awake/alert Behavior During Therapy: WFL for tasks assessed/performed Overall Cognitive Status: Within Functional Limits for tasks assessed                                          Exercises Total Joint Exercises  Ankle Circles/Pumps: AROM, 10 reps Quad Sets: Strengthening, 10 reps Short Arc Quad: AAROM, 10 reps (lacks AROM TKE) Heel Slides: AAROM, Strengthening, 10 reps (resisted leg ext) Hip ABduction/ADduction: AROM, 10 reps    General Comments General comments (skin integrity, edema, etc.): Pt remains motivated but functionally limited      Pertinent Vitals/Pain Pain Assessment Pain Assessment: 0-10 Pain Score: 0-No pain Pain Location: indicates some minimal pain with WBing and exercise    Home Living                          Prior Function             PT Goals (current goals can now be found in the care plan section) Progress towards PT goals: Progressing toward goals    Frequency    BID      PT Plan Current plan remains appropriate    Co-evaluation              AM-PAC PT "6 Clicks" Mobility   Outcome Measure  Help needed turning from your back to your side while in a flat bed without using bedrails?: A Lot Help needed moving from lying on your back to sitting on the side of a flat bed without using bedrails?: A Lot Help needed moving to and from a bed to a chair (including a wheelchair)?: Total Help needed standing up from a chair using your arms (e.g., wheelchair or bedside chair)?: Total Help needed to walk in hospital room?: Total Help needed climbing 3-5 steps with a railing? : Total 6 Click Score: 8    End of Session Equipment Utilized During Treatment: Gait belt Activity Tolerance: Patient limited by fatigue Patient left: with call bell/phone within reach;with family/visitor present;with bed alarm set Nurse Communication: Mobility status PT Visit Diagnosis: Muscle weakness (generalized) (M62.81);Difficulty in walking, not elsewhere classified (R26.2);Pain Pain - Right/Left: Left Pain - part of body: Hip     Time: 1610-9604 PT Time Calculation (min) (ACUTE ONLY): 40 min  Charges:    $Gait Training: 8-22 mins $Therapeutic Exercise: 8-22 mins $Therapeutic Activity: 8-22 mins PT General Charges $$ ACUTE PT VISIT: 1 Visit                     Malachi Pro, DPT 07/20/2023, 3:09 PM

## 2023-07-21 DIAGNOSIS — S72002A Fracture of unspecified part of neck of left femur, initial encounter for closed fracture: Secondary | ICD-10-CM | POA: Diagnosis not present

## 2023-07-21 MED ORDER — CLONIDINE HCL 0.2 MG/24HR TD PTWK
0.2000 mg | MEDICATED_PATCH | TRANSDERMAL | Status: DC
  Administered 2023-07-21: 0.2 mg via TRANSDERMAL
  Filled 2023-07-21: qty 1

## 2023-07-21 MED ORDER — DORZOLAMIDE HCL-TIMOLOL MAL 2-0.5 % OP SOLN
1.0000 [drp] | Freq: Two times a day (BID) | OPHTHALMIC | Status: DC
  Administered 2023-07-22 – 2023-07-24 (×5): 1 [drp] via OPHTHALMIC
  Filled 2023-07-21: qty 10

## 2023-07-21 NOTE — TOC Progression Note (Addendum)
Transition of Care [facility]) - Progression Note    Patient Details  Name: Kelsey Martin MRN: [id number] Date of Birth: 1930-04-17  Transition of Care [facility]) CM/SW Contact  Marlowe Sax, RN Phone Number: 07/21/2023, 1:12 PM  Clinical Narrative:     Sherron Monday with Kelsey Martin the patients daughter I reviewed the bed offer, she accepted the offer from [facility], I notified Tiffany at [facility], Ins auth approved ref number [id number] 8/1-8/5 [facility] will have a bed otmorrow   Barriers to Discharge: Continued Medical Work up  Expected Discharge Plan and Services     Post Acute Care Choice: Home Health, Skilled Nursing Facility Living arrangements for the past 2 months: Single Family Home                                       Social Determinants of Health (SDOH) Interventions SDOH Screenings   Food Insecurity: No Food Insecurity (07/19/2023)  Housing: Low Risk  (07/19/2023)  Transportation Needs: No Transportation Needs (07/19/2023)  Utilities: Not At Risk (07/19/2023)  Tobacco Use: Medium Risk (07/19/2023)    Readmission Risk Interventions     No data to display

## 2023-07-21 NOTE — Plan of Care (Signed)
  Problem: Education: °Goal: Knowledge of General Education information will improve °Description: Including pain rating scale, medication(s)/side effects and non-pharmacologic comfort measures °Outcome: Progressing °  °Problem: Health Behavior/Discharge Planning: °Goal: Ability to manage health-related needs will improve °Outcome: Progressing °  °Problem: Clinical Measurements: °Goal: Ability to maintain clinical measurements within normal limits will improve °Outcome: Progressing °Goal: Will remain free from infection °Outcome: Progressing °Goal: Diagnostic test results will improve °Outcome: Progressing °Goal: Respiratory complications will improve °Outcome: Progressing °Goal: Cardiovascular complication will be avoided °Outcome: Progressing °  °Problem: Coping: °Goal: Level of anxiety will decrease °Outcome: Progressing °  °Problem: Nutrition: °Goal: Adequate nutrition will be maintained °Outcome: Progressing °  °Problem: Elimination: °Goal: Will not experience complications related to bowel motility °Outcome: Progressing °Goal: Will not experience complications related to urinary retention °Outcome: Progressing °  °Problem: Pain Managment: °Goal: General experience of comfort will improve °Outcome: Progressing °  °Problem: Safety: °Goal: Ability to remain free from injury will improve °Outcome: Progressing °  °Problem: Skin Integrity: °Goal: Risk for impaired skin integrity will decrease °Outcome: Progressing °  °

## 2023-07-21 NOTE — NC FL2 (Signed)
[place] MEDICAID FL2 LEVEL OF CARE FORM     IDENTIFICATION  Patient Name: MARDY OLLIVIERRE Birthdate: 28-Mar-1930 Sex: female Admission Date (Current Location): 07/19/2023  [facility] and [place] Number:  Chiropodist and Address:  [facility], [address], [place], [place] [address]      Provider Number: [id number]  Attending Physician Name and Address:  Lurene Shadow, MD  Relative Name and Phone Number:  Randa Evens [phone]    Current Level of Care: Hospital Recommended Level of Care: Skilled Nursing Facility Prior Approval Number:    Date Approved/Denied:   PASRR Number: [id number]  Discharge Plan: SNF    Current Diagnoses: Patient Active Problem List   Diagnosis Date Noted   Closed fracture of left hip (HCC) 07/19/2023   Fall at home, initial encounter 07/19/2023   New onset atrial fibrillation (HCC) 07/19/2023   Diarrhea 07/19/2023   Hypertensive urgency 07/19/2023   CAD (coronary artery disease)    Hypertension    Chronic hyponatremia 08/01/2019    Orientation RESPIRATION BLADDER Height & Weight     Self  Normal Continent Weight: 59.9 kg Height:  5\' 5"  (165.1 cm)  BEHAVIORAL SYMPTOMS/MOOD NEUROLOGICAL BOWEL NUTRITION STATUS      Continent Diet (See DC summary)  AMBULATORY STATUS COMMUNICATION OF NEEDS Skin   Extensive Assist Verbally Normal, Surgical wounds                       Personal Care Assistance Level of Assistance  Bathing, Feeding, Dressing Bathing Assistance: Limited assistance Feeding assistance: Limited assistance Dressing Assistance: Maximum assistance     Functional Limitations Info  Sight, Hearing, Speech Sight Info: Adequate Hearing Info: Adequate Speech Info: Adequate    SPECIAL CARE FACTORS FREQUENCY  PT (By licensed PT), OT (By licensed OT)     PT Frequency: 5 times per month OT Frequency: 5 times per week            Contractures Contractures Info: Not present     Additional Factors Info  Code Status, Allergies Code Status Info: DNR Allergies Info: Prozac (Fluoxetine), Amlodipine           Current Medications (07/21/2023):  This is the current hospital active medication list Current Facility-Administered Medications  Medication Dose Route Frequency Provider Last Rate Last Admin   0.9 %  sodium chloride infusion   Intravenous Continuous Poggi, Excell Seltzer, MD 75 mL/hr at 07/19/23 1437 Restarted at 07/19/23 1647   0.9 %  sodium chloride infusion   Intravenous Continuous Poggi, Excell Seltzer, MD 75 mL/hr at 07/20/23 0348 Infusion Verify at 07/20/23 0348   acetaminophen (TYLENOL) tablet 325-650 mg  325-650 mg Oral Q6H PRN Poggi, Excell Seltzer, MD       ALPRAZolam Prudy Feeler) tablet 0.5 mg  0.5 mg Oral QHS PRN Lorretta Harp, MD   0.5 mg at 07/20/23 2118   apixaban (ELIQUIS) tablet 2.5 mg  2.5 mg Oral BID Christena Flake, MD   2.5 mg at 07/21/23 0809   atorvastatin (LIPITOR) tablet 20 mg  20 mg Oral QHS Lorretta Harp, MD   20 mg at 07/20/23 2113   benazepril (LOTENSIN) tablet 20 mg  20 mg Oral Daily Lurene Shadow, MD   20 mg at 07/21/23 8469   bisacodyl (DULCOLAX) suppository 10 mg  10 mg Rectal Daily PRN Poggi, Excell Seltzer, MD       bisoprolol (ZEBETA) tablet 5 mg  5 mg Oral Daily Lorretta Harp, MD  5 mg at 07/21/23 0823   brimonidine (ALPHAGAN) 0.2 % ophthalmic solution 1 drop  1 drop Left Eye Marice Potter, MD   1 drop at 07/20/23 2113   [START ON 07/26/2023] cloNIDine (CATAPRES - Dosed in mg/24 hr) patch 0.2 mg  0.2 mg Transdermal Weekly Lorretta Harp, MD       diphenhydrAMINE (BENADRYL) 12.5 MG/5ML elixir 12.5-25 mg  12.5-25 mg Oral Q4H PRN Poggi, Excell Seltzer, MD       docusate sodium (COLACE) capsule 100 mg  100 mg Oral BID Poggi, Excell Seltzer, MD   100 mg at 07/21/23 0809   dorzolamide-timolol (COSOPT) 2-0.5 % ophthalmic solution 1 drop  1 drop Both Eyes BID Lorretta Harp, MD   1 drop at 07/21/23 4098   feeding supplement (ENSURE ENLIVE / ENSURE PLUS) liquid 237 mL  237 mL Oral BID BM Lorretta Harp, MD        hydrALAZINE (APRESOLINE) injection 10 mg  10 mg Intravenous Q2H PRN Poggi, Excell Seltzer, MD   10 mg at 07/19/23 1103   hydrALAZINE (APRESOLINE) tablet 50 mg  50 mg Oral QID Lorretta Harp, MD   50 mg at 07/21/23 0809   isosorbide mononitrate (IMDUR) 24 hr tablet 60 mg  60 mg Oral Daily Lorretta Harp, MD   60 mg at 07/21/23 0809   latanoprost (XALATAN) 0.005 % ophthalmic solution 1 drop  1 drop Both Eyes QHS Lorretta Harp, MD   1 drop at 07/20/23 2115   magnesium hydroxide (MILK OF MAGNESIA) suspension 30 mL  30 mL Oral Daily PRN Poggi, Excell Seltzer, MD       methocarbamol (ROBAXIN) tablet 500 mg  500 mg Oral Q8H PRN Poggi, Excell Seltzer, MD       metoCLOPramide (REGLAN) tablet 5-10 mg  5-10 mg Oral Q8H PRN Poggi, Excell Seltzer, MD       Or   metoCLOPramide (REGLAN) injection 5-10 mg  5-10 mg Intravenous Q8H PRN Poggi, Excell Seltzer, MD       morphine (PF) 2 MG/ML injection 1 mg  1 mg Intravenous Q4H PRN Poggi, Excell Seltzer, MD       multivitamin with minerals tablet 1 tablet  1 tablet Oral Daily Lorretta Harp, MD   1 tablet at 07/21/23 0809   ondansetron (ZOFRAN) tablet 4 mg  4 mg Oral Q6H PRN Poggi, Excell Seltzer, MD       Or   ondansetron (ZOFRAN) injection 4 mg  4 mg Intravenous Q6H PRN Poggi, Excell Seltzer, MD       oxyCODONE-acetaminophen (PERCOCET/ROXICET) 5-325 MG per tablet 1 tablet  1 tablet Oral Q4H PRN Poggi, Excell Seltzer, MD   1 tablet at 07/21/23 1191   senna-docusate (Senokot-S) tablet 1 tablet  1 tablet Oral QHS PRN Poggi, Excell Seltzer, MD       sodium phosphate (FLEET) 7-19 GM/118ML enema 1 enema  1 enema Rectal Once PRN Poggi, Excell Seltzer, MD         Discharge Medications: Please see discharge summary for a list of discharge medications.  Relevant Imaging Results:  Relevant Lab Results:   Additional Information SS# [id number]  Marlowe Sax, RN

## 2023-07-21 NOTE — Progress Notes (Addendum)
Progress Note    Kelsey Martin  UEA:[id number] DOB: May 24, 1930  DOA: 07/19/2023 PCP: Marguarite Arbour, MD      Brief Narrative:    Medical records reviewed and are as summarized below:  Kelsey Martin is a 89 y.o. female  with medical history significant of CAD, HLD, HTN, anxiety, chronic hyponatremia (120-130), who presented to the hospital with left hip pain following a fall.  Reportedly, she had had multiple falls at home.  She was found to have displaced left femoral neck fracture.  She underwent left hip bipolar hemiarthroplasty on 07/19/2023.  She was treated with analgesics.      Assessment/Plan:   Principal Problem:   Closed fracture of left hip [facility]) Active Problems:   Fall at home, initial encounter   New onset atrial fibrillation (HCC)   CAD (coronary artery disease)   Hypertension   Hypertensive urgency   Chronic hyponatremia   Diarrhea   Nutrition Problem: Increased nutrient needs Etiology: hip fracture  Signs/Symptoms: estimated needs   Body mass index is 21.97 kg/m.   Close left hip (femoral neck) fracture, s/p fall at home: S/p left hip bipolar hemiarthroplasty on 07/19/2023.  Continue analgesics as needed for pain.  Follow-up with the surgeon.  PT recommended discharge to SNF.   Acute blood loss anemia: Hemoglobin is down to 8.8.  Hemoglobin was 12.5 on admission.  She is asymptomatic.  No indication for blood transfusion at this time.  Repeat CBC tomorrow. Leukocytosis: This is probably reactive.   Atrial fibrillation, new onset: Continue bisoprolol and Eliquis. CHA2DS2-VASc score is 6. 2D echo showed EF estimated at 60 to 65%, indeterminate LV diastolic parameters.   Hypomagnesemia and hypokalemia: Improved   CAD: Continue Eliquis.  Aspirin has been discontinued.  Discussed with cardiology team   Hypertension: Continue antihypertensives   Chronic hyponatremia: Asymptomatic  Diet Order             Diet regular Fluid  consistency: Thin  Diet effective now                            Consultants: Orthopedic surgeon Cardiologist  Procedures: Left hip bipolar hemiarthroplasty    Medications:    apixaban  2.5 mg Oral BID   atorvastatin  20 mg Oral QHS   benazepril  20 mg Oral Daily   bisoprolol  5 mg Oral Daily   brimonidine  1 drop Left Eye QHS   cloNIDine  0.2 mg Transdermal Weekly   docusate sodium  100 mg Oral BID   dorzolamide-timolol  1 drop Both Eyes BID   feeding supplement  237 mL Oral BID BM   hydrALAZINE  50 mg Oral QID   isosorbide mononitrate  60 mg Oral Daily   latanoprost  1 drop Both Eyes QHS   multivitamin with minerals  1 tablet Oral Daily   Continuous Infusions:  sodium chloride 75 mL/hr at 07/19/23 1437   sodium chloride Stopped (07/21/23 1051)     Anti-infectives (From admission, onward)    Start     Dose/Rate Route Frequency Ordered Stop   07/19/23 2000  ceFAZolin (ANCEF) IVPB 2g/100 mL premix        2 g 200 mL/hr over 30 Minutes Intravenous Every 8 hours 07/19/23 1900 07/20/23 1916   07/19/23 1300  ceFAZolin (ANCEF) IVPB 2g/100 mL premix        2 g 200 mL/hr over 30 Minutes Intravenous 30  min pre-op 07/19/23 1046 07/20/23 0853              Family Communication/Anticipated D/C date and plan/Code Status   DVT prophylaxis: apixaban (ELIQUIS) tablet 2.5 mg Start: 07/20/23 1000 SCDs Start: 07/19/23 1901 Place TED hose Start: 07/19/23 1901 apixaban (ELIQUIS) tablet 2.5 mg     Code Status: DNR  Family Communication: Plan discussed with Kelsey Martin, daughter, at the bedside Disposition Plan: Plan to discharge to SNF   Status is: Inpatient Remains inpatient appropriate because: S/p left hip surgery       Subjective:   Interval events noted.  She has no complaints.  She feels better.  Kelsey Martin, daughter and HPOA, was at the bedside.  She said patient worked with PT today.  Objective:    Vitals:   07/20/23 1131 07/20/23 2355 07/21/23  0743 07/21/23 0758  BP: (!) 144/85 (!) 155/71 (!) 119/98 (!) 187/83  Pulse: (!) 58 (!) 58 (!) 101 74  Resp: 18 20 18 19   Temp: (!) 97.4 F (36.3 C) 98.6 F (37 C) 98.1 F (36.7 C) 98.4 F (36.9 C)  TempSrc: Oral   Oral  SpO2: 98% 98% 98% 98%  Weight:      Height:       No data found.   Intake/Output Summary (Last 24 hours) at 07/21/2023 1238 Last data filed at 07/21/2023 0022 Gross per 24 hour  Intake 240 ml  Output 1100 ml  Net -860 ml   Filed Weights   07/19/23 1341  Weight: 59.9 kg    Exam:  GEN: NAD SKIN: Warm and dry EYES: No pallor or icterus ENT: MMM CV: Irregular rate and rhythm PULM: CTA B ABD: soft, ND, NT, +BS CNS: AAO x 3, non focal EXT: Dressing on left hip surgical wound is clean, dry and intact      Data Reviewed:   I have personally reviewed following labs and imaging studies:  Labs: Labs show the following:   Basic Metabolic Panel: Recent Labs  Lab 07/19/23 0957 07/19/23 1233 07/19/23 2302 07/20/23 0537 07/20/23 0716 07/21/23 0530  NA 127* 126* 127*  --  129* 127*  K 3.5 3.5 3.2*  --  3.5 4.6  CL 93* 93* 94*  --  97* 97*  CO2 23 24 22   --  21* 21*  GLUCOSE 115* 119* 220*  --  140* 121*  BUN 9 9 11   --  16 22  CREATININE 0.56 0.69 0.81  --  0.85 0.77  CALCIUM 9.7 9.6 9.2  --  9.3 9.1  MG  --   --   --  1.6*  --  2.2   GFR Estimated Creatinine Clearance: 42.1 mL/min (by C-G formula based on SCr of 0.77 mg/dL). Liver Function Tests: Recent Labs  Lab 07/19/23 0957  AST 25  ALT 16  ALKPHOS 49  BILITOT 1.2  PROT 6.8  ALBUMIN 3.9   No results for input(s): "LIPASE", "AMYLASE" in the last 168 hours. No results for input(s): "AMMONIA" in the last 168 hours. Coagulation profile Recent Labs  Lab 07/19/23 1132  INR 1.1    CBC: Recent Labs  Lab 07/19/23 0957 07/20/23 0537 07/21/23 0530  WBC 10.6* 13.6* 14.8*  HGB 12.5 10.5* 8.8*  HCT 35.1* 29.2* 24.3*  MCV 91.6 90.1 90.7  PLT 240 241 234   Cardiac Enzymes: Recent  Labs  Lab 07/19/23 0957  CKTOTAL 43   BNP (last 3 results) No results for input(s): "PROBNP" in the last 8760 hours.  CBG: No results for input(s): "GLUCAP" in the last 168 hours. D-Dimer: No results for input(s): "DDIMER" in the last 72 hours. Hgb A1c: No results for input(s): "HGBA1C" in the last 72 hours. Lipid Profile: No results for input(s): "CHOL", "HDL", "LDLCALC", "TRIG", "CHOLHDL", "LDLDIRECT" in the last 72 hours. Thyroid function studies: Recent Labs    07/19/23 1233  TSH 0.899   Anemia work up: No results for input(s): "VITAMINB12", "FOLATE", "FERRITIN", "TIBC", "IRON", "RETICCTPCT" in the last 72 hours. Sepsis Labs: Recent Labs  Lab 07/19/23 0957 07/20/23 0537 07/21/23 0530  WBC 10.6* 13.6* 14.8*    Microbiology Recent Results (from the past 240 hour(s))  Surgical PCR screen     Status: None   Collection Time: 07/19/23  9:57 AM   Specimen: Nasal Mucosa; Nasal Swab  Result Value Ref Range Status   MRSA, PCR NEGATIVE NEGATIVE Final   Staphylococcus aureus NEGATIVE NEGATIVE Final    Comment: (NOTE) The Xpert SA Assay (FDA approved for NASAL specimens in patients 76 years of age and older), is one component of a comprehensive surveillance program. It is not intended to diagnose infection nor to guide or monitor treatment. Performed at [facility], [address] [place], [place] [address]     Procedures and diagnostic studies:  ECHOCARDIOGRAM COMPLETE  Result Date: 07/20/2023    ECHOCARDIOGRAM REPORT   Patient Name:   Kelsey Martin Date of Exam: 07/19/2023 Medical Rec #:  [id number]      Height:       65.0 in Accession #:    [id number]     Weight:       132.0 lb Date of Birth:  08/04/1930      BSA:          1.658 m Patient Age:    89 years       BP:           215/188 mmHg Patient Gender: F              HR:           60 bpm. Exam Location:  [facility] Procedure: 2D Echo, Cardiac Doppler and Color Doppler Indications:     I48.91 Atrial Fibrillation   History:         Patient has no prior history of Echocardiogram examinations.                  CAD; Risk Factors:Hypertension and Dyslipidemia.  Sonographer:     Daphine Deutscher RDCS Referring Phys:  [id number] Cheryln Manly TANG Diagnosing Phys: Marcina Millard MD IMPRESSIONS  1. Left ventricular ejection fraction, by estimation, is 60 to 65%. The left ventricle has normal function. The left ventricle has no regional wall motion abnormalities. Indeterminate diastolic filling due to E-A fusion.  2. Right ventricular systolic function is normal. The right ventricular size is normal.  3. The mitral valve is normal in structure. No evidence of mitral valve regurgitation. No evidence of mitral stenosis.  4. The aortic valve is normal in structure. Aortic valve regurgitation is trivial. No aortic stenosis is present.  5. The inferior vena cava is normal in size with greater than 50% respiratory variability, suggesting right atrial pressure of 3 mmHg. FINDINGS  Left Ventricle: Left ventricular ejection fraction, by estimation, is 60 to 65%. The left ventricle has normal function. The left ventricle has no regional wall motion abnormalities. The left ventricular internal cavity size was normal in size. There is  no left ventricular hypertrophy. Indeterminate  diastolic filling due to E-A fusion. Right Ventricle: The right ventricular size is normal. No increase in right ventricular wall thickness. Right ventricular systolic function is normal. Left Atrium: Left atrial size was normal in size. Right Atrium: Right atrial size was normal in size. Pericardium: There is no evidence of pericardial effusion. Mitral Valve: The mitral valve is normal in structure. No evidence of mitral valve regurgitation. No evidence of mitral valve stenosis. Tricuspid Valve: The tricuspid valve is normal in structure. Tricuspid valve regurgitation is not demonstrated. No evidence of tricuspid stenosis. Aortic Valve: The aortic valve is  normal in structure. Aortic valve regurgitation is trivial. No aortic stenosis is present. Pulmonic Valve: The pulmonic valve was normal in structure. Pulmonic valve regurgitation is not visualized. No evidence of pulmonic stenosis. Aorta: The aortic root is normal in size and structure. Venous: The inferior vena cava is normal in size with greater than 50% respiratory variability, suggesting right atrial pressure of 3 mmHg. IAS/Shunts: No atrial level shunt detected by color flow Doppler.  LEFT VENTRICLE PLAX 2D LVIDd:         3.80 cm LVIDs:         2.60 cm LV PW:         1.10 cm LV IVS:        1.10 cm  RIGHT VENTRICLE             IVC RV Basal diam:  3.70 cm     IVC diam: 1.20 cm RV S prime:     11.40 cm/s TAPSE (M-mode): 1.7 cm LEFT ATRIUM             Index        RIGHT ATRIUM           Index LA diam:        3.80 cm 2.29 cm/m   RA Area:     14.50 cm LA Vol (A2C):   40.1 ml 24.19 ml/m  RA Volume:   35.70 ml  21.53 ml/m LA Vol (A4C):   53.4 ml 32.21 ml/m LA Biplane Vol: 47.3 ml 28.53 ml/m  AORTIC VALVE LVOT Vmax:   92.55 cm/s LVOT Vmean:  59.200 cm/s LVOT VTI:    0.170 m  AORTA Ao Root diam: 3.20 cm MV E velocity: 102.35 cm/s                             SHUNTS                             Systemic VTI: 0.17 m Marcina Millard MD Electronically signed by Marcina Millard MD Signature Date/Time: 07/20/2023/7:39:50 AM    Final    MR BRAIN WO CONTRAST  Result Date: 07/20/2023 CLINICAL DATA:  Initial evaluation for neuro deficit, stroke suspected. EXAM: MRI HEAD WITHOUT CONTRAST TECHNIQUE: Multiplanar, multiecho pulse sequences of the brain and surrounding structures were obtained without intravenous contrast. COMPARISON:  Prior study from earlier the same day. FINDINGS: Brain: Diffuse prominence of the CSF containing spaces compatible generalized age-related cerebral atrophy. Chronic infarct noted at the right parietal lobe. Few small remote lacunar infarcts noted about the right basal ganglia and  bilateral thalami. Few tiny remote bilateral cerebellar infarcts noted. Underlying mild chronic microvascular ischemic disease for age. No evidence for acute or subacute ischemia. Gray-white matter differentiation maintained. No acute intracranial hemorrhage. Few punctate chronic micro hemorrhages noted, likely small vessel  related. No mass lesion, midline shift or mass effect. No hydrocephalus or extra-axial fluid collection. Pituitary gland is suprasellar region within normal limits. Vascular: Major intracranial vascular flow voids are maintained. Skull and upper cervical spine: Craniocervical junction within normal limits. Bone marrow signal intensity normal. No scalp soft tissue abnormality. Sinuses/Orbits: Prior bilateral ocular lens replacement. Paranasal sinuses are clear. No significant mastoid effusion. Other: None. IMPRESSION: 1. No acute intracranial abnormality. 2. Chronic right parietal infarct, with a few additional remote lacunar infarcts involving the right basal ganglia, bilateral thalami, and cerebellum. 3. Underlying age-related cerebral atrophy with mild chronic small vessel ischemic disease. Electronically Signed   By: Rise Mu M.D.   On: 07/20/2023 04:18   DG HIP UNILAT W OR W/O PELVIS 2-3 VIEWS LEFT  Result Date: 07/19/2023 CLINICAL DATA:  Status post hip hemiarthroplasty. EXAM: DG HIP (WITH OR WITHOUT PELVIS) 2-3V LEFT COMPARISON:  Preoperative imaging FINDINGS: Left hip arthroplasty in expected alignment. No periprosthetic lucency or fracture. Recent postsurgical change includes air and edema in the soft tissues. Lateral skin staples in place. IMPRESSION: Left hip arthroplasty without immediate postoperative complication. Electronically Signed   By: Narda Rutherford M.D.   On: 07/19/2023 18:09               LOS: 2 days   Cedrica Brune  [facility]   Pager on [email or web address]. If 7PM-7AM, please contact night-coverage at [email or web address]     07/21/2023,  12:38 PM

## 2023-07-21 NOTE — Progress Notes (Signed)
Physical Therapy Treatment Patient Details Name: Kelsey Martin MRN: [id number] DOB: 08-31-1930 Today's Date: 07/21/2023   History of Present Illness 89 y/o female s/p fall at home.  Suffered L hip fracture and is now s/p total hip replacement (posterior approach) 7/30.    PT Comments  Pt was just finishing up with OT upon arrival. She was seated EOB, A and cooperative. Agrees to session and remains motivated throughout. She was able to stand and take several steps to recliner prior to standing and ambulating into hallway. HR elevated to low 100s bpm but pt overall tolerated well. She was able to take a seated rest in hallway prior to ambulating back to her room. Pt is progressing well. She will continue to benefit from skilled PT to maximize her independence and safety with all ADLs.     If plan is discharge home, recommend the following: A lot of help with walking and/or transfers;A lot of help with bathing/dressing/bathroom;Assistance with cooking/housework;Direct supervision/assist for medications management;Direct supervision/assist for financial management;Assist for transportation;Help with stairs or ramp for entrance     Equipment Recommendations  Other (comment) (Defer to next level of care)       Precautions / Restrictions Precautions Precautions: Posterior Hip;Fall Precaution Booklet Issued: Yes (comment) Restrictions Weight Bearing Restrictions: Yes RLE Weight Bearing: Weight bearing as tolerated     Mobility  Bed Mobility  General bed mobility comments: Pt was seated EOB upon arrival. Just worked with OT    Transfers Overall transfer level: Needs assistance Equipment used: Rolling walker (2 wheels) Transfers: Sit to/from Stand Sit to Stand: Mod assist, Min assist  General transfer comment: min-mod assist of one with vcs for technique and handplacement    Ambulation/Gait Ambulation/Gait assistance: Min guard, Min assist Gait Distance (Feet): 40 Feet Assistive  device: Rolling walker (2 wheels) Gait Pattern/deviations: Step-to pattern, Trunk flexed, Antalgic Gait velocity: decreased  General Gait Details: Pt was able to ambulate to RN station and take seated rest prior to ambulation 2nd time. Overall tolerated well but is limited by fatigue more so than pain. HR stable throughout with peal in low 100s bpm    Balance Overall balance assessment: Needs assistance Sitting-balance support: Feet supported Sitting balance-Leahy Scale: Good     Standing balance support: Bilateral upper extremity supported Standing balance-Leahy Scale: Fair Standing balance comment: reliant on BUE support for dynamic standing activity       Cognition Arousal/Alertness: Awake/alert Behavior During Therapy: WFL for tasks assessed/performed Overall Cognitive Status: Within Functional Limits for tasks assessed      General Comments: Pt is A and cooperative. Per daughter, seems to be close to baseline cognition.           General Comments General comments (skin integrity, edema, etc.): will return for PM/BID session and ambulate + perform HEP handout. pt's daughter will encourage ther ex between sessions.      Pertinent Vitals/Pain Pain Assessment Pain Assessment: 0-10 Pain Score: 2  Pain Location: indicates some minimal pain with WBing and exercise Pain Descriptors / Indicators: Discomfort Pain Intervention(s): Limited activity within patient's tolerance, Monitored during session, Premedicated before session, Repositioned    Home Living Family/patient expects to be discharged to:: Skilled nursing facility Living Arrangements: Children     Additional Comments: 3 steps with enter with L rail, has walker (FWW?), with daughter/S-I-L    Prior Function      PT Goals (current goals can now be found in the care plan section) Acute Rehab PT Goals Patient Stated  Goal: " Get better and go home." Progress towards PT goals: Progressing toward goals     Frequency    BID      PT Plan Current plan remains appropriate       AM-PAC PT "6 Clicks" Mobility   Outcome Measure  Help needed turning from your back to your side while in a flat bed without using bedrails?: A Lot Help needed moving from lying on your back to sitting on the side of a flat bed without using bedrails?: A Lot Help needed moving to and from a bed to a chair (including a wheelchair)?: A Lot Help needed standing up from a chair using your arms (e.g., wheelchair or bedside chair)?: A Lot Help needed to walk in hospital room?: A Little Help needed climbing 3-5 steps with a railing? : A Lot 6 Click Score: 13    End of Session   Activity Tolerance: Patient tolerated treatment well;Patient limited by fatigue Patient left: in chair;with call bell/phone within reach;with chair alarm set;with family/visitor present Nurse Communication: Mobility status PT Visit Diagnosis: Muscle weakness (generalized) (M62.81);Difficulty in walking, not elsewhere classified (R26.2);Pain Pain - Right/Left: Left Pain - part of body: Hip     Time: 5093-2671 PT Time Calculation (min) (ACUTE ONLY): 18 min  Charges:    $Gait Training: 8-22 mins PT General Charges $$ ACUTE PT VISIT: 1 Visit                     Jetta Lout PTA 07/21/23, 11:18 AM

## 2023-07-21 NOTE — TOC Progression Note (Addendum)
Transition of Care [facility]) - Progression Note    Patient Details  Name: Kelsey Martin MRN: [id number] Date of Birth: 12/01/1930  Transition of Care [facility]) CM/SW Contact  Marlowe Sax, RN Phone Number: 07/21/2023, 9:28 AM  Clinical Narrative:     Reached out to the patients daughter Kelsey Martin She stated that [place] and would prefer [facility] [place], bedsearch and PASSR complete, will review the bed offers once obtained,    Barriers to Discharge: Continued Medical Work up  Expected Discharge Plan and Services     Post Acute Care Choice: Home Health, Skilled Nursing Facility Living arrangements for the past 2 months: Single Family Home                                       Social Determinants of Health (SDOH) Interventions SDOH Screenings   Food Insecurity: No Food Insecurity (07/19/2023)  Housing: Low Risk  (07/19/2023)  Transportation Needs: No Transportation Needs (07/19/2023)  Utilities: Not At Risk (07/19/2023)  Tobacco Use: Medium Risk (07/19/2023)    Readmission Risk Interventions     No data to display

## 2023-07-21 NOTE — Progress Notes (Signed)
Nutrition Follow-up  DOCUMENTATION CODES:   Non-severe (moderate) malnutrition in context of chronic illness  INTERVENTION:   -Continue regular diet -Continue Ensure Enlive po BID, each supplement provides 350 kcal and 20 grams of protein -Continue MVI with minerals daily  NUTRITION DIAGNOSIS:   Moderate Malnutrition related to chronic illness as evidenced by mild fat depletion, moderate fat depletion, mild muscle depletion, moderate muscle depletion.  Ongoing  GOAL:   Patient will meet greater than or equal to 90% of their needs  Progressing   MONITOR:   PO intake, Supplement acceptance  REASON FOR ASSESSMENT:   Consult Hip fracture protocol  ASSESSMENT:   89 y/o female with h/o CAD, HTN, Afib, anxiety, depression, GERD and ventral hernia who is admitted with hip fracture after fall.  7/30- s/p Left hip bipolar hemiarthroplasty.   Reviewed I/O's: -860 ml x 24 hours and -308 ml since admission  UOP: 1.1 L x 24 hours   Spoke with pt and daughter at bedside. Pt is pleasant and in good spirits, reports feeling better. Pt daughter provided most of the history. Pt is eating better here than she usually does at home. She consumed about 50% of breakfast, but reports meals are often interrupted. Pt ate 1000% fo her dinner last night.   Pt typically consumes 2 meals per day PTA. Meals consist of eggs or cereal and a vegetable plate.   Daughter unsure if pt has lost weight and unsure of UBW. No recent wt noted in chart, but noted distant history of weight loss.   Discussed importance of good meal and supplement intake to promote healing. Pt amenable to supplements.   Per TOC notes, plan for SNF placement tomorrow.   Labs reviewed: Na: 127.    NUTRITION - FOCUSED PHYSICAL EXAM:  Flowsheet Row Most Recent Value  Orbital Region Mild depletion  Upper Arm Region Moderate depletion  Thoracic and Lumbar Region No depletion  Buccal Region Mild depletion  Temple Region  Moderate depletion  Clavicle Bone Region Mild depletion  Clavicle and Acromion Bone Region Mild depletion  Scapular Bone Region Mild depletion  Dorsal Hand Moderate depletion  Patellar Region No depletion  Anterior Thigh Region No depletion  Posterior Calf Region No depletion  Edema (RD Assessment) Mild  Hair Reviewed  Eyes Reviewed  Mouth Reviewed  Skin Reviewed  Nails Reviewed       Diet Order:   Diet Order             Diet regular Fluid consistency: Thin  Diet effective now                   EDUCATION NEEDS:   Education needs have been addressed  Skin:  Skin Assessment: Skin Integrity Issues: Skin Integrity Issues:: Incisions, Other (Comment) Incisions: lt hip Other: skin tear to rt pretibial  Last BM:  pta  Height:   Ht Readings from Last 1 Encounters:  07/19/23 5\' 5"  (1.651 m)    Weight:   Wt Readings from Last 1 Encounters:  07/19/23 59.9 kg    Ideal Body Weight:  56.8 kg  BMI:  Body mass index is 21.97 kg/m.  Estimated Nutritional Needs:   Kcal:  1600-1800  Protein:  80-95 grams  Fluid:  > 1.6 L    Kelsey Martin, RD, LDN, CDCES Registered Dietitian II Certified Diabetes Care and Education Specialist Please refer to [facility] for RD and/or RD on-call/weekend/after hours pager

## 2023-07-21 NOTE — Progress Notes (Signed)
  Subjective: 2 Days Post-Op Procedure(s) (LRB): ARTHROPLASTY BIPOLAR HIP (HEMIARTHROPLASTY) (Left) Patient reports pain as mild in bed this morning. Patient is well, current plan is for discharge to SNF pending continued progress with therapy. Patient would like to try to return home if possible.  Had a long discussion on what it would take for her to return home. Negative for chest pain and shortness of breath Fever: no Gastrointestinal:Negative for nausea and vomiting  Objective: Vital signs in last 24 hours: Temp:  [97.4 F (36.3 C)-98.6 F (37 C)] 98.4 F (36.9 C) (08/01 0758) Pulse Rate:  [58-101] 74 (08/01 0758) Resp:  [18-20] 19 (08/01 0758) BP: (119-187)/(71-98) 187/83 (08/01 0758) SpO2:  [98 %] 98 % (08/01 0758)  Intake/Output from previous day:  Intake/Output Summary (Last 24 hours) at 07/21/2023 1042 Last data filed at 07/21/2023 0022 Gross per 24 hour  Intake 240 ml  Output 1100 ml  Net -860 ml    Intake/Output this shift: No intake/output data recorded.  Labs: Recent Labs    07/19/23 0957 07/20/23 0537 07/21/23 0530  HGB 12.5 10.5* 8.8*   Recent Labs    07/20/23 0537 07/21/23 0530  WBC 13.6* 14.8*  RBC 3.24* 2.68*  HCT 29.2* 24.3*  PLT 241 234   Recent Labs    07/20/23 0716 07/21/23 0530  NA 129* 127*  K 3.5 4.6  CL 97* 97*  CO2 21* 21*  BUN 16 22  CREATININE 0.85 0.77  GLUCOSE 140* 121*  CALCIUM 9.3 9.1   Recent Labs    07/19/23 1132  INR 1.1     EXAM General - Patient is Alert and Appropriate.  Daughter at bedside. Extremity - ABD soft Neurovascular intact Dorsiflexion/Plantar flexion intact Incision: dressing C/D/I No cellulitis present Compartment soft Dressing/Incision - clean, dry, no drainage noted to the left hip honeycomb dressing. Motor Function - intact, moving foot and toes well on exam.  ABdomen soft with intact bowel sounds this morning.  Past Medical History:  Diagnosis Date   Abdominal hernia    Anxiety     CAD (coronary artery disease)    HLD (hyperlipidemia)    Hypertension     Assessment/Plan: 2 Days Post-Op Procedure(s) (LRB): ARTHROPLASTY BIPOLAR HIP (HEMIARTHROPLASTY) (Left) Principal Problem:   Closed fracture of left hip (HCC) Active Problems:   Chronic hyponatremia   CAD (coronary artery disease)   Hypertension   Fall at home, initial encounter   New onset atrial fibrillation (HCC)   Diarrhea   Hypertensive urgency  Estimated body mass index is 21.97 kg/m as calculated from the following:   Height as of this encounter: 5\' 5"  (1.651 m).   Weight as of this encounter: 59.9 kg. Advance diet Up with therapy D/C IV fluids when tolerating po intake.  Labs reviewed this AM. WBC 14.8, Hg 8.8 Up with therapy today.  She lives at home with family.  Hopeful she can return home. Continue to work on BM.  She is passing gas this morning. Did discuss what it would take to return home vs. Going to rehab.  DVT Prophylaxis - TED hose and Eliquis Weight-Bearing as tolerated to left leg  J. Horris Latino, PA-C [facility] 07/21/2023, 10:42 AM

## 2023-07-21 NOTE — Plan of Care (Signed)

## 2023-07-21 NOTE — Progress Notes (Signed)
Physical Therapy Treatment Patient Details Name: Kelsey Martin MRN: [id number] DOB: 16-Mar-1930 Today's Date: 07/21/2023   History of Present Illness 89 y/o female s/p fall at home.  Suffered L hip fracture and is now s/p total hip replacement (posterior approach) 7/30.    PT Comments  Pt was sitting in recliner upon arrival. She remains alert and agreeable to session. Was able to stand with assistance and tolerate gait ~ 80 ft without LOB. Pt tends to have slow flexed posture but with vcs is able to correct but unable to maintain. Pt is progressing but remains far from baseline abilities. Acute pt will continue to follow and progress per current POC. Pt will benefit from post acute PT to maximize independence and safety with ADLs while decreasing caregiver burden.     If plan is discharge home, recommend the following: A lot of help with walking and/or transfers;A lot of help with bathing/dressing/bathroom;Assistance with cooking/housework;Direct supervision/assist for medications management;Direct supervision/assist for financial management;Assist for transportation;Help with stairs or ramp for entrance     Equipment Recommendations  Other (comment) (DEfer to next level of care.)       Precautions / Restrictions Precautions Precautions: Posterior Hip;Fall Precaution Booklet Issued: Yes (comment) Restrictions Weight Bearing Restrictions: Yes RLE Weight Bearing: Weight bearing as tolerated     Mobility  Bed Mobility Overal bed mobility: Needs Assistance Bed Mobility: Sit to Supine  Sit to supine: Mod assist, Max assist General bed mobility comments: Pt requires extensive vcs and assistance to return to supine form EOB sitting.    Transfers Overall transfer level: Needs assistance Equipment used: Rolling walker (2 wheels) Transfers: Sit to/from Stand Sit to Stand: Min assist, Mod assist  General transfer comment: pt stood 2 x from recliner    Ambulation/Gait Ambulation/Gait  assistance: Min guard, Min assist Gait Distance (Feet): 80 Feet Assistive device: Rolling walker (2 wheels) Gait Pattern/deviations: Step-to pattern, Trunk flexed, Antalgic Gait velocity: decreased     General Gait Details: Pt was able to tolerate increased gait distance with only 2 standing rest. Antalgic flexed posture but with VCs is able to correct but unable to maintain.    Balance Overall balance assessment: Needs assistance Sitting-balance support: Single extremity supported Sitting balance-Leahy Scale: Good     Standing balance support: Bilateral upper extremity supported Standing balance-Leahy Scale: Poor Standing balance comment: reliant on BUE support for dynamic standing activity       Cognition Arousal/Alertness: Awake/alert Behavior During Therapy: WFL for tasks assessed/performed Overall Cognitive Status: Within Functional Limits for tasks assessed      General Comments: Pt is A and cooperative. Supportive daughter present and lives with pt at home.        Exercises Total Joint Exercises Ankle Circles/Pumps: AROM, 10 reps Quad Sets: Strengthening, 10 reps Heel Slides: AROM, 10 reps, Supine (flat bed) Hip ABduction/ADduction: AROM, 10 reps Straight Leg Raises: AAROM, 10 reps    General Comments General comments (skin integrity, edema, etc.): Pt endorses severe fatigue and requested to return to bed after ambulation      Pertinent Vitals/Pain Pain Assessment Pain Assessment: No/denies pain Pain Score: 0-No pain Pain Location: indicates some minimal pain with WBing and exercise Pain Descriptors / Indicators: Discomfort Pain Intervention(s): Limited activity within patient's tolerance, Monitored during session, Premedicated before session, Repositioned     PT Goals (current goals can now be found in the care plan section) Acute Rehab PT Goals Patient Stated Goal: " Get better and go home." Progress towards PT goals:  Progressing toward goals     Frequency    BID      PT Plan Current plan remains appropriate       AM-PAC PT "6 Clicks" Mobility   Outcome Measure  Help needed turning from your back to your side while in a flat bed without using bedrails?: A Lot Help needed moving from lying on your back to sitting on the side of a flat bed without using bedrails?: A Lot Help needed moving to and from a bed to a chair (including a wheelchair)?: A Lot Help needed standing up from a chair using your arms (e.g., wheelchair or bedside chair)?: A Lot Help needed to walk in hospital room?: A Little Help needed climbing 3-5 steps with a railing? : A Lot 6 Click Score: 13    End of Session   Activity Tolerance: Patient tolerated treatment well Patient left: in bed;with call bell/phone within reach;with bed alarm set Nurse Communication: Mobility status PT Visit Diagnosis: Muscle weakness (generalized) (M62.81);Difficulty in walking, not elsewhere classified (R26.2);Pain Pain - Right/Left: Left Pain - part of body: Hip     Time: 1404-1430 PT Time Calculation (min) (ACUTE ONLY): 26 min  Charges:    $Gait Training: 8-22 mins $Therapeutic Exercise: 8-22 mins PT General Charges $$ ACUTE PT VISIT: 1 Visit                    Jetta Lout PTA 07/21/23, 3:50 PM

## 2023-07-21 NOTE — Evaluation (Signed)
Occupational Therapy Evaluation Patient Details Name: Kelsey Martin MRN: [id number] DOB: Mar 05, 1930 Today's Date: 07/21/2023   History of Present Illness 89 y/o female s/p fall at home.  Suffered L hip fracture and is now s/p total hip replacement (posterior approach) 7/30.   Clinical Impression   Pt was seen for OT evaluation this date. Prior to hospital admission, pt was ambulating short household distances holding onto furniture. Daughter present and reports pt has had a couple falls and requires assist for pericare after [facility] toileting, assist IADL, and minor assist for dressing. Pt presents to acute OT demonstrating impaired ADL performance and functional mobility 2/2 decreased strength, balance, and knowledge of precautions (See OT problem list for additional functional deficits). Pt currently requires initial MOD-MAX A for bed mobility and initial transfer EOB improving with repeated attempts requiring VC for hand placement from recliner and MIN A +1 (+chair follow). Pt requires MAX A for LB ADL tasks. Pt able to recall 1/3 precautions at start of session, 2/3 at end. Pt/family educated in precautions and how to maintain during ADL/mobility. Pt would benefit from skilled OT services to address noted impairments and functional limitations (see below for any additional details) in order to maximize safety and independence while minimizing falls risk and caregiver burden.    Recommendations for follow up therapy are one component of a multi-disciplinary discharge planning process, led by the attending physician.  Recommendations may be updated based on patient status, additional functional criteria and insurance authorization.   Assistance Recommended at Discharge Frequent or constant Supervision/Assistance  Patient can return home with the following A lot of help with walking and/or transfers;A lot of help with bathing/dressing/bathroom;Assist for transportation;Assistance with  cooking/housework    Functional Status Assessment  Patient has had a recent decline in their functional status and demonstrates the ability to make significant improvements in function in a reasonable and predictable amount of time.  Equipment Recommendations  Other (comment) (defer to next venue)    Recommendations for Other Services       Precautions / Restrictions Precautions Precautions: Posterior Hip;Fall Precaution Booklet Issued: Yes (comment) Restrictions Weight Bearing Restrictions: Yes RLE Weight Bearing: Weight bearing as tolerated      Mobility Bed Mobility Overal bed mobility: Needs Assistance Bed Mobility: Supine to Sit     Supine to sit: Mod assist, Max assist          Transfers Overall transfer level: Needs assistance Equipment used: Rolling walker (2 wheels) Transfers: Sit to/from Stand Sit to Stand: Mod assist, Max assist           General transfer comment: MOD-MAX x1 initial from EOB, improving to MIN A +2 to CGA-MIN A +1 (+chair follow)      Balance Overall balance assessment: Needs assistance Sitting-balance support: Single extremity supported Sitting balance-Leahy Scale: Good     Standing balance support: Bilateral upper extremity supported Standing balance-Leahy Scale: Poor                             ADL either performed or assessed with clinical judgement   ADL                                         General ADL Comments: Pt requires MAX A for LB ADL tasks, set up and supv for seated UB ADL tasks  Vision         Perception     Praxis      Pertinent Vitals/Pain Pain Assessment Pain Assessment: No/denies pain     Hand Dominance     Extremity/Trunk Assessment Upper Extremity Assessment Upper Extremity Assessment: Generalized weakness;Overall [facility] for tasks assessed   Lower Extremity Assessment Lower Extremity Assessment: Generalized weakness;Overall WFL for tasks assessed (R LE  grossly 4-/5, L LE grossly 3-/5. L knee OA and genu valgus aparent.)       Communication Communication Communication: No difficulties   Cognition Arousal/Alertness: Awake/alert Behavior During Therapy: WFL for tasks assessed/performed Overall Cognitive Status: Within Functional Limits for tasks assessed                                       General Comments  will return for PM/BID session and ambulate + perform HEP handout. pt's daughter will encourage ther ex between sessions.    Exercises Other Exercises Other Exercises: Pt/family educated in posterior THPs and how to maintain   Shoulder Instructions      Home Living Family/patient expects to be discharged to:: Skilled nursing facility Living Arrangements: Children                               Additional Comments: 3 steps with enter with L rail, has walker (FWW?), with daughter/S-I-L      Prior Functioning/Environment Prior Level of Function : Independent/Modified Independent             Mobility Comments: apparently she was up ad lib in the home w/o AD, but funiture cruising and has had 2 other recent falls ADLs Comments: uses BSC beside the bed, assist for pericare/clothing mgt, sat EOB and able to dress herself and feed herself, daughter does all IADL        OT Problem List: Decreased strength;Decreased activity tolerance;Impaired balance (sitting and/or standing);Decreased knowledge of use of DME or AE;Decreased knowledge of precautions      OT Treatment/Interventions: Self-care/ADL training;Therapeutic exercise;Therapeutic activities;DME and/or AE instruction;Patient/family education;Balance training    OT Goals(Current goals can be found in the care plan section) Acute Rehab OT Goals Patient Stated Goal: get better and go home OT Goal Formulation: With patient/family Time For Goal Achievement: 08/04/23 Potential to Achieve Goals: Good ADL Goals Pt Will Transfer to Toilet:  with min assist;ambulating;bedside commode (LRAD, maintaining precautions) Pt Will Perform Toileting - Clothing Manipulation and hygiene: with caregiver independent in assisting Additional ADL Goal #1: Pt will verbalize 100% of precautions and maintain during ADL/mobility wiht PRN VC, 4/4 opportunities. Additional ADL Goal #2: Pt will complete LB dressing with caregiver independently assisting while maintaining posterior hip precautions.  OT Frequency: Min 1X/week    Co-evaluation              AM-PAC OT "6 Clicks" Daily Activity     Outcome Measure Help from another person eating meals?: None Help from another person taking care of personal grooming?: A Little Help from another person toileting, which includes using toliet, bedpan, or urinal?: A Lot Help from another person bathing (including washing, rinsing, drying)?: A Lot Help from another person to put on and taking off regular upper body clothing?: A Little Help from another person to put on and taking off regular lower body clothing?: A Lot 6 Click Score: 16   End of  Session Equipment Utilized During Treatment: Gait belt;Rolling walker (2 wheels)  Activity Tolerance: Patient tolerated treatment well Patient left: in chair;with call bell/phone within reach;Other (comment) (PTA)  OT Visit Diagnosis: Other abnormalities of gait and mobility (R26.89);History of falling (Z91.81)                Time: 3474-2595 OT Time Calculation (min): 20 min Charges:  OT General Charges $OT Visit: 1 Visit OT Evaluation $OT Eval Moderate Complexity: 1 Mod OT Treatments $Self Care/Home Management : 8-22 mins  Arman Filter., MPH, MS, OTR/L ascom [phone] 07/21/23, 11:22 AM

## 2023-07-22 DIAGNOSIS — E440 Moderate protein-calorie malnutrition: Secondary | ICD-10-CM | POA: Insufficient documentation

## 2023-07-22 DIAGNOSIS — S72002A Fracture of unspecified part of neck of left femur, initial encounter for closed fracture: Secondary | ICD-10-CM | POA: Diagnosis not present

## 2023-07-22 MED ORDER — OXYCODONE-ACETAMINOPHEN 5-325 MG PO TABS: 1.0000 | ORAL_TABLET | ORAL | 0 refills | Status: DC | PRN

## 2023-07-22 MED ORDER — BENAZEPRIL HCL 20 MG PO TABS
40.0000 mg | ORAL_TABLET | Freq: Every day | ORAL | Status: DC
  Administered 2023-07-23 – 2023-07-24 (×2): 40 mg via ORAL
  Filled 2023-07-22 (×2): qty 2

## 2023-07-22 MED ORDER — APIXABAN 2.5 MG PO TABS: 2.5000 mg | ORAL_TABLET | Freq: Two times a day (BID) | ORAL | 0 refills | Status: DC

## 2023-07-22 MED ORDER — ORAL CARE MOUTH RINSE: 15.0000 mL | OROMUCOSAL | Status: DC | PRN

## 2023-07-22 MED ORDER — FUROSEMIDE 40 MG PO TABS
40.0000 mg | ORAL_TABLET | Freq: Once | ORAL | Status: AC
  Administered 2023-07-22: 40 mg via ORAL
  Filled 2023-07-22: qty 1

## 2023-07-22 MED ORDER — ONDANSETRON HCL 4 MG PO TABS: 4.0000 mg | ORAL_TABLET | Freq: Four times a day (QID) | ORAL | 0 refills | Status: DC | PRN

## 2023-07-22 MED ORDER — BRIMONIDINE TARTRATE 0.2 % OP SOLN
1.0000 [drp] | Freq: Every morning | OPHTHALMIC | Status: DC
  Administered 2023-07-23 – 2023-07-24 (×2): 1 [drp] via OPHTHALMIC
  Filled 2023-07-22: qty 5

## 2023-07-22 NOTE — Progress Notes (Signed)
Physical Therapy Treatment Patient Details Name: Kelsey Martin MRN: [id number] DOB: 1930/12/05 Today's Date: 07/22/2023   History of Present Illness 89 y/o female s/p fall at home.  Suffered L hip fracture and is now s/p total hip replacement (posterior approach) 7/30.    PT Comments  Pt was long sitting in bed upon arrival. Supportive daughter at bedside.States pt has been sleeping a lot since last issued pain meds > 4 hour earlier. She agrees to session and is cooperative throughout. Overall less pain noted versus AM session. She tolerated increased ambulation and ther ex with  slightly less assistance. Still requires extensive assistance to safely get out/in bed. Acute PT will continue to follow and progress per current POC. She will benefit from continued skilled PT to maximize independence and safety with all ADLs.    If plan is discharge home, recommend the following: Assistance with cooking/housework;Direct supervision/assist for medications management;Direct supervision/assist for financial management;Assist for transportation;Help with stairs or ramp for entrance;A little help with walking and/or transfers;A little help with bathing/dressing/bathroom     Equipment Recommendations  Other (comment)       Precautions / Restrictions Precautions Precautions: Posterior Hip;Fall Precaution Booklet Issued: Yes (comment) Restrictions Weight Bearing Restrictions: Yes RLE Weight Bearing: Weight bearing as tolerated     Mobility  Bed Mobility Overal bed mobility: Needs Assistance Bed Mobility: Supine to Sit, Sidelying to Sit, Sit to Supine, Sit to Sidelying Sidelying to sit: Min assist Supine to sit: Mod assist Sit to supine: Mod assist Sit to sidelying: Mod assist   Transfers Overall transfer level: Needs assistance Equipment used: Rolling walker (2 wheels) Transfers: Sit to/from Stand Sit to Stand: Min assist, From elevated surface   Ambulation/Gait Ambulation/Gait assistance:  Min guard Gait Distance (Feet): 100 Feet Assistive device: Rolling walker (2 wheels) Gait Pattern/deviations: Step-to pattern, Trunk flexed, Antalgic Gait velocity: decreased  General Gait Details: Pt was able to tolerate increased gait distance with less antalgic gait pattern.   Balance Overall balance assessment: Needs assistance Sitting-balance support: Feet supported Sitting balance-Leahy Scale: Good     Standing balance support: Bilateral upper extremity supported Standing balance-Leahy Scale: Poor Standing balance comment: reliant on BUE support for dynamic standing activity    Cognition Arousal/Alertness: Awake/alert Behavior During Therapy: WFL for tasks assessed/performed Overall Cognitive Status: Within Functional Limits for tasks assessed    General Comments: Pt is A and cooperative. Supportive daughter present        Exercises Total Joint Exercises Ankle Circles/Pumps: AROM, 10 reps Quad Sets: Strengthening, 10 reps Gluteal Sets: AROM, 10 reps Hip ABduction/ADduction: AAROM, 10 reps        Pertinent Vitals/Pain Pain Assessment Pain Assessment: 0-10 Pain Score: 5  Pain Location: L hip Pain Descriptors / Indicators: Discomfort Pain Intervention(s): Limited activity within patient's tolerance, Monitored during session, Premedicated before session, Patient requesting pain meds-RN notified     PT Goals (current goals can now be found in the care plan section) Acute Rehab PT Goals Patient Stated Goal: " Get better and go home." Progress towards PT goals: Progressing toward goals    Frequency    BID      PT Plan Current plan remains appropriate       AM-PAC PT "6 Clicks" Mobility   Outcome Measure  Help needed turning from your back to your side while in a flat bed without using bedrails?: A Little Help needed moving from lying on your back to sitting on the side of a flat bed without using  bedrails?: A Lot Help needed moving to and from a bed to a  chair (including a wheelchair)?: A Lot Help needed standing up from a chair using your arms (e.g., wheelchair or bedside chair)?: A Lot Help needed to walk in hospital room?: A Little Help needed climbing 3-5 steps with a railing? : A Lot 6 Click Score: 14    End of Session Equipment Utilized During Treatment: Gait belt Activity Tolerance: Patient tolerated treatment well Patient left: in bed;with call bell/phone within reach;with bed alarm set Nurse Communication: Mobility status PT Visit Diagnosis: Muscle weakness (generalized) (M62.81);Difficulty in walking, not elsewhere classified (R26.2);Pain Pain - Right/Left: Left Pain - part of body: Hip     Time: 2956-2130 PT Time Calculation (min) (ACUTE ONLY): 24 min  Charges:    $Gait Training: 8-22 mins $Therapeutic Exercise: 8-22 mins PT General Charges $$ ACUTE PT VISIT: 1 Visit                    Jetta Lout PTA 07/22/23, 4:54 PM

## 2023-07-22 NOTE — Progress Notes (Signed)
Physical Therapy Treatment Patient Details Name: Kelsey Martin MRN: [id number] DOB: 19-Jan-1930 Today's Date: 07/22/2023   History of Present Illness 89 y/o female s/p fall at home.  Suffered L hip fracture and is now s/p total hip replacement (posterior approach) 7/30.    PT Comments  Pt is A but does endorse increased pain today. She was cooperative and pleasant but pain limited. She continues to require extensive assistance to safely exit bed, stand, and tolerate ambulation. RN notified of request for pain medications. Chartered loss adjuster discussed with pt/pt's daughters x 2 that its not uncommon to have more severe pain POD day 2. Acute PT will continue to follow and progress per current POC progressing as able per pt tolerance. DC recs remain appropriate. Will return this afternoon for PM session.    If plan is discharge home, recommend the following: A lot of help with walking and/or transfers;A lot of help with bathing/dressing/bathroom;Assistance with cooking/housework;Direct supervision/assist for medications management;Direct supervision/assist for financial management;Assist for transportation;Help with stairs or ramp for entrance     Equipment Recommendations  Other (comment) (Defer to next level of care.)       Precautions / Restrictions Precautions Precautions: Posterior Hip;Fall Precaution Booklet Issued: Yes (comment) Restrictions Weight Bearing Restrictions: Yes RLE Weight Bearing: Weight bearing as tolerated     Mobility  Bed Mobility Overal bed mobility: Needs Assistance Bed Mobility: Supine to Sit, Sidelying to Sit, Sit to Supine, Sit to Sidelying Sidelying to sit: Min assist Supine to sit: Mod assist, Max assist Sit to supine: Mod assist, Max assist Sit to sidelying: Mod assist, Max assist    Transfers Overall transfer level: Needs assistance Equipment used: Rolling walker (2 wheels) Transfers: Sit to/from Stand Sit to Stand: Min assist, Mod assist    Ambulation/Gait Ambulation/Gait assistance: Min guard, Min assist Gait Distance (Feet): 60 Feet Assistive device: Rolling walker (2 wheels) Gait Pattern/deviations: Step-to pattern, Trunk flexed, Antalgic Gait velocity: decreased  General Gait Details: Pt has much more antalgic gait pattern from previous date. RN notified pt requesting pain medications    Balance Overall balance assessment: Needs assistance Sitting-balance support: Feet supported Sitting balance-Leahy Scale: Good     Standing balance support: Bilateral upper extremity supported Standing balance-Leahy Scale: Poor Standing balance comment: reliant on BUE support for dynamic standing activity       Cognition Arousal/Alertness: Awake/alert Behavior During Therapy: WFL for tasks assessed/performed Overall Cognitive Status: Within Functional Limits for tasks assessed    General Comments: Pt is A and cooperative. Supportive daughter present and lives with pt at home.           General Comments General comments (skin integrity, edema, etc.): Will address ther ex in PM session.      Pertinent Vitals/Pain Pain Assessment Pain Assessment: 0-10 Pain Score: 8  Pain Location: L hip Pain Descriptors / Indicators: Discomfort Pain Intervention(s): Limited activity within patient's tolerance, Monitored during session, Repositioned, Patient requesting pain meds-RN notified     PT Goals (current goals can now be found in the care plan section) Acute Rehab PT Goals Patient Stated Goal: " Get better and go home." Progress towards PT goals: Progressing toward goals    Frequency    BID      PT Plan Current plan remains appropriate       AM-PAC PT "6 Clicks" Mobility   Outcome Measure  Help needed turning from your back to your side while in a flat bed without using bedrails?: A Lot Help needed moving from  lying on your back to sitting on the side of a flat bed without using bedrails?: A Lot Help needed  moving to and from a bed to a chair (including a wheelchair)?: A Lot Help needed standing up from a chair using your arms (e.g., wheelchair or bedside chair)?: A Lot Help needed to walk in hospital room?: A Little Help needed climbing 3-5 steps with a railing? : A Lot 6 Click Score: 13    End of Session Equipment Utilized During Treatment: Gait belt Activity Tolerance: Patient limited by pain Patient left: in bed;with call bell/phone within reach;with bed alarm set Nurse Communication: Mobility status PT Visit Diagnosis: Muscle weakness (generalized) (M62.81);Difficulty in walking, not elsewhere classified (R26.2);Pain Pain - Right/Left: Left Pain - part of body: Hip     Time: 1046-1110 PT Time Calculation (min) (ACUTE ONLY): 24 min  Charges:    $Gait Training: 8-22 mins $Therapeutic Activity: 8-22 mins PT General Charges $$ ACUTE PT VISIT: 1 Visit                     Jetta Lout PTA 07/22/23, 11:43 AM

## 2023-07-22 NOTE — Progress Notes (Signed)
Progress Note    Kelsey Martin  FAO:[id number] DOB: 19-Feb-1930  DOA: 07/19/2023 PCP: Marguarite Arbour, MD      Brief Narrative:    Medical records reviewed and are as summarized below:  Kelsey Martin is a 89 y.o. female  with medical history significant of CAD, HLD, HTN, anxiety, chronic hyponatremia (120-130), who presented to the hospital with left hip pain following a fall.  Reportedly, she had had multiple falls at home.  She was found to have displaced left femoral neck fracture.  She underwent left hip bipolar hemiarthroplasty on 07/19/2023.  She was treated with analgesics.      Assessment/Plan:   Principal Problem:   Closed fracture of left hip [facility]) Active Problems:   Fall at home, initial encounter   New onset atrial fibrillation (HCC)   CAD (coronary artery disease)   Hypertension   Hypertensive urgency   Chronic hyponatremia   Diarrhea   Malnutrition of moderate degree   Nutrition Problem: Moderate Malnutrition Etiology: chronic illness  Signs/Symptoms: mild fat depletion, moderate fat depletion, mild muscle depletion, moderate muscle depletion   Body mass index is 21.97 kg/m.   Close left hip (femoral neck) fracture, s/p fall at home: S/p left hip bipolar hemiarthroplasty on 07/19/2023. Patient developed more pain in the left hip after she worked with PT today.  She was concerned about going to SNF today.  Randa Evens, daughter, was also concerned about patient going to SNF today. She requested that patient not be discharged today. Continue analgesics as needed for pain.  Continue PT and OT.   Acute blood loss anemia: Hemoglobin is down to 8.1.  She is asymptomatic.  Hemoglobin was 12.5 on admission.  No indication for blood transfusion at this time.   Leukocytosis: This is probably reactive.  Improving.   Atrial fibrillation, new onset: Continue bisoprolol and Eliquis. CHA2DS2-VASc score is 6. 2D echo showed EF estimated at 60 to 65%,  indeterminate LV diastolic parameters.   Hypomagnesemia and hypokalemia: Improved   CAD: Continue Eliquis.  Aspirin has been discontinued.  Discussed with cardiology team   Hypertension: Continue antihypertensives   Chronic hyponatremia: Asymptomatic  Diet Order             Diet regular Fluid consistency: Thin  Diet effective now                            Consultants: Orthopedic surgeon Cardiologist  Procedures: Left hip bipolar hemiarthroplasty    Medications:    apixaban  2.5 mg Oral BID   atorvastatin  20 mg Oral QHS   benazepril  20 mg Oral Daily   bisoprolol  5 mg Oral Daily   brimonidine  1 drop Left Eye QHS   cloNIDine  0.2 mg Transdermal Weekly   docusate sodium  100 mg Oral BID   dorzolamide-timolol  1 drop Both Eyes BID   feeding supplement  237 mL Oral BID BM   hydrALAZINE  50 mg Oral QID   isosorbide mononitrate  60 mg Oral Daily   latanoprost  1 drop Both Eyes QHS   multivitamin with minerals  1 tablet Oral Daily   Continuous Infusions:     Anti-infectives (From admission, onward)    Start     Dose/Rate Route Frequency Ordered Stop   07/19/23 2000  ceFAZolin (ANCEF) IVPB 2g/100 mL premix        2 g 200 mL/hr over 30  Minutes Intravenous Every 8 hours 07/19/23 1900 07/20/23 1916   07/19/23 1300  ceFAZolin (ANCEF) IVPB 2g/100 mL premix        2 g 200 mL/hr over 30 Minutes Intravenous 30 min pre-op 07/19/23 1046 07/20/23 0853              Family Communication/Anticipated D/C date and plan/Code Status   DVT prophylaxis: apixaban (ELIQUIS) tablet 2.5 mg Start: 07/20/23 1000 SCDs Start: 07/19/23 1901 Place TED hose Start: 07/19/23 1901 apixaban (ELIQUIS) tablet 2.5 mg     Code Status: DNR  Family Communication: Plan discussed with Randa Evens, daughter, over the phone and Judeth Cornfield, daughter, at the bedside  Disposition Plan: Plan to discharge to SNF   Status is: Inpatient Remains inpatient appropriate because: S/p  left hip surgery       Subjective:   Interval events noted.  Patient said she feels fine.  No dizziness, nausea or vomiting.  Pain is better.  Judeth Cornfield, daughter, was at the bedside  Objective:    Vitals:   07/21/23 1431 07/21/23 1703 07/22/23 0027 07/22/23 0900  BP: (!) 167/89 (!) 179/85 (!) 164/88 (!) 182/87  Pulse: 90 84 75 74  Resp: 18 19 17 16   Temp: 98 F (36.7 C) 98.7 F (37.1 C) 98.5 F (36.9 C) 98.1 F (36.7 C)  TempSrc: Oral Oral  Oral  SpO2: 97% 99% 98% 97%  Weight:      Height:       No data found.   Intake/Output Summary (Last 24 hours) at 07/22/2023 1149 Last data filed at 07/22/2023 1105 Gross per 24 hour  Intake 640 ml  Output --  Net 640 ml   Filed Weights   07/19/23 1341  Weight: 59.9 kg    Exam:   GEN: NAD SKIN: Warm and dry EYES: No pallor or icterus ENT: MMM CV: RRR PULM: CTA B ABD: soft, ND, NT, +BS CNS: AAO x 3, non focal EXT: Mild surgical tenderness on the left hip.  Dressing on left hip surgical wound is clean, dry and intact   Data Reviewed:   I have personally reviewed following labs and imaging studies:  Labs: Labs show the following:   Basic Metabolic Panel: Recent Labs  Lab 07/19/23 0957 07/19/23 1233 07/19/23 2302 07/20/23 0537 07/20/23 0716 07/21/23 0530 07/22/23 0547  NA 127* 126* 127*  --  129* 127* 126*  K 3.5 3.5 3.2*  --  3.5 4.6  --   CL 93* 93* 94*  --  97* 97*  --   CO2 23 24 22   --  21* 21*  --   GLUCOSE 115* 119* 220*  --  140* 121*  --   BUN 9 9 11   --  16 22  --   CREATININE 0.56 0.69 0.81  --  0.85 0.77  --   CALCIUM 9.7 9.6 9.2  --  9.3 9.1  --   MG  --   --   --  1.6*  --  2.2  --    GFR Estimated Creatinine Clearance: 42.1 mL/min (by C-G formula based on SCr of 0.77 mg/dL). Liver Function Tests: Recent Labs  Lab 07/19/23 0957  AST 25  ALT 16  ALKPHOS 49  BILITOT 1.2  PROT 6.8  ALBUMIN 3.9   No results for input(s): "LIPASE", "AMYLASE" in the last 168 hours. No results for  input(s): "AMMONIA" in the last 168 hours. Coagulation profile Recent Labs  Lab 07/19/23 1132  INR 1.1  CBC: Recent Labs  Lab 07/19/23 0957 07/20/23 0537 07/21/23 0530 07/22/23 0547  WBC 10.6* 13.6* 14.8* 12.7*  NEUTROABS  --   --   --  10.3*  HGB 12.5 10.5* 8.8* 8.1*  HCT 35.1* 29.2* 24.3* 22.8*  MCV 91.6 90.1 90.7 91.2  PLT 240 241 234 224   Cardiac Enzymes: Recent Labs  Lab 07/19/23 0957  CKTOTAL 43   BNP (last 3 results) No results for input(s): "PROBNP" in the last 8760 hours. CBG: No results for input(s): "GLUCAP" in the last 168 hours. D-Dimer: No results for input(s): "DDIMER" in the last 72 hours. Hgb A1c: No results for input(s): "HGBA1C" in the last 72 hours. Lipid Profile: No results for input(s): "CHOL", "HDL", "LDLCALC", "TRIG", "CHOLHDL", "LDLDIRECT" in the last 72 hours. Thyroid function studies: Recent Labs    07/19/23 1233  TSH 0.899   Anemia work up: No results for input(s): "VITAMINB12", "FOLATE", "FERRITIN", "TIBC", "IRON", "RETICCTPCT" in the last 72 hours. Sepsis Labs: Recent Labs  Lab 07/19/23 0957 07/20/23 0537 07/21/23 0530 07/22/23 0547  WBC 10.6* 13.6* 14.8* 12.7*    Microbiology Recent Results (from the past 240 hour(s))  Surgical PCR screen     Status: None   Collection Time: 07/19/23  9:57 AM   Specimen: Nasal Mucosa; Nasal Swab  Result Value Ref Range Status   MRSA, PCR NEGATIVE NEGATIVE Final   Staphylococcus aureus NEGATIVE NEGATIVE Final    Comment: (NOTE) The Xpert SA Assay (FDA approved for NASAL specimens in patients 40 years of age and older), is one component of a comprehensive surveillance program. It is not intended to diagnose infection nor to guide or monitor treatment. Performed at [facility], [address] [place], [place] [address]     Procedures and diagnostic studies:  No results found.             LOS: 3 days      [facility]   Pager on  [email or web address]. If 7PM-7AM, please contact night-coverage at [email or web address]     07/22/2023, 11:49 AM

## 2023-07-22 NOTE — Plan of Care (Signed)

## 2023-07-22 NOTE — Progress Notes (Signed)
PT Cancellation Note  Patient Details Name: Kelsey Martin MRN: [id number] DOB: [birth year]   Cancelled Treatment:     PT attempt. Pt c/o stomach pain and requested author to return later. Will return as requested and continue to follow per current POC.    Rushie Chestnut 07/22/2023, 2:03 PM

## 2023-07-22 NOTE — Discharge Instructions (Signed)
Instructions after Hip Replacement     J. Derald Macleod, M.D.  Valeria Batman, PA-C     Dept. of Orthopaedics & Sports Medicine  [facility]  [address]  [place], [place]  [address]  Phone: [phone]   Fax: [phone]    DIET: Drink plenty of non-alcoholic fluids. Resume your normal diet. Include foods high in fiber.  ACTIVITY:  You may use crutches or a walker with weight-bearing as tolerated, unless instructed otherwise. You may be weaned off of the walker or crutches by your Physical Therapist.  Do NOT reach below the level of your knees or cross your legs until allowed.    Continue doing gentle exercises. Exercising will reduce the pain and swelling, increase motion, and prevent muscle weakness.   Please continue to use the TED compression stockings for 6 weeks. You may remove the stockings at night, but should reapply them in the morning. Do not drive or operate any equipment until instructed.  WOUND CARE:  Continue to use ice packs periodically to reduce pain and swelling. Keep the incision clean and dry. You may bathe or shower after the staples are removed at the first office visit following surgery.  MEDICATIONS: You may resume your regular medications. Please take the pain medication as prescribed on the medication. Do not take pain medication on an empty stomach. You have been given a prescription for a blood thinner to prevent blood clots. Please take the medication as instructed. (NOTE: After completing a 2 week course of Eliquis, take one Enteric-coated aspirin once a day.) Pain medications and iron supplements can cause constipation. Use a stool softener (Senokot or Colace) on a daily basis and a laxative (dulcolax or miralax) as needed. Do not drive or drink alcoholic beverages when taking pain medications.  CALL THE OFFICE FOR: Temperature above 101 degrees Excessive bleeding or drainage on the dressing. Excessive swelling, coldness, or  paleness of the toes. Persistent nausea and vomiting.  FOLLOW-UP:  You should have an appointment to return to the office in 2 weeks after surgery. Arrangements have been made for continuation of Physical Therapy (either home therapy or outpatient therapy).

## 2023-07-22 NOTE — TOC Progression Note (Signed)
Transition of Care [facility]) - Progression Note    Patient Details  Name: AZYRIAH NEVINS MRN: [id number] Date of Birth: 10/06/1930  Transition of Care [facility]) CM/SW Contact  Marlowe Sax, RN Phone Number: 07/22/2023, 11:37 AM  Clinical Narrative:    Spoke with the patient's daughter Randa Evens She stated that the patient is not ready to DC today to [facility] I explained that the insurance will expire on Monday, [facility] will not accept admissions over the weekend, so the patient will DC On Monday to room 513 at [facility], Sports administrator at [employer] to Discharge: Continued Medical Work up  [facility]     Post Acute Care Choice: Home Health, Skilled Nursing Facility Living arrangements for the past 2 months: Single Family Home                                       Social Determinants of Health (SDOH) Interventions SDOH Screenings   Food Insecurity: No Food Insecurity (07/19/2023)  Housing: Low Risk  (07/19/2023)  Transportation Needs: No Transportation Needs (07/19/2023)  Utilities: Not At Risk (07/19/2023)  Tobacco Use: Medium Risk (07/19/2023)    Readmission Risk Interventions     No data to display

## 2023-07-23 DIAGNOSIS — S72002A Fracture of unspecified part of neck of left femur, initial encounter for closed fracture: Secondary | ICD-10-CM | POA: Diagnosis not present

## 2023-07-23 MED ORDER — LACTULOSE 10 GM/15ML PO SOLN
20.0000 g | Freq: Every day | ORAL | Status: DC | PRN
  Administered 2023-07-23: 20 g via ORAL
  Filled 2023-07-23: qty 30

## 2023-07-23 MED ORDER — BUSPIRONE HCL 10 MG PO TABS
5.0000 mg | ORAL_TABLET | Freq: Three times a day (TID) | ORAL | Status: AC
  Administered 2023-07-23 (×2): 5 mg via ORAL
  Filled 2023-07-23 (×2): qty 1

## 2023-07-23 NOTE — Progress Notes (Signed)
Physical Therapy Treatment Patient Details Name: Kelsey Martin MRN: [id number] DOB: [birth year] Today's Date: 07/23/2023   History of Present Illness 89 y/o female s/p fall at home.  Suffered L hip fracture and is now s/p total hip replacement (posterior approach) 7/30.    PT Comments  Pt was long sitting in bed with supportive daughter present. Pt is alert and agreeable. Demonstrated improved abilities to exit bed, stand, and tolerate ambulation in hallway. Pt is progressing well towards all PT goals. DC recs remain appropriate however family voiced possibility of dcing directly home.    If plan is discharge home, recommend the following: Assistance with cooking/housework;Direct supervision/assist for medications management;Direct supervision/assist for financial management;Assist for transportation;Help with stairs or ramp for entrance;A little help with walking and/or transfers;A little help with bathing/dressing/bathroom     Equipment Recommendations  Other (comment)       Precautions / Restrictions Precautions Precautions: Posterior Hip;Fall Precaution Booklet Issued: Yes (comment) Restrictions Weight Bearing Restrictions: Yes RLE Weight Bearing: Weight bearing as tolerated     Mobility  Bed Mobility Overal bed mobility: Needs Assistance Bed Mobility: Supine to Sit, Sit to Supine Sidelying to sit: Min assist Supine to sit: Min assist, Mod assist   Transfers Overall transfer level: Needs assistance Equipment used: Rolling walker (2 wheels) Transfers: Sit to/from Stand Sit to Stand: Min assist     Ambulation/Gait Ambulation/Gait assistance: Min guard Gait Distance (Feet): 120 Feet Assistive device: Rolling walker (2 wheels) Gait Pattern/deviations: Step-to pattern, Trunk flexed, Antalgic Gait velocity: decreased General Gait Details: Pt was able to ambulate ~ 120 ft with CGA for safety. constant vsc fopr posture correction.    Balance Overall balance assessment: Needs  assistance Sitting-balance support: Feet supported Sitting balance-Leahy Scale: Good     Standing balance support: Bilateral upper extremity supported Standing balance-Leahy Scale: Poor Standing balance comment: reliant on BUE support for dynamic standing activity       Cognition Arousal/Alertness: Awake/alert Behavior During Therapy: WFL for tasks assessed/performed Overall Cognitive Status: Within Functional Limits for tasks assessed      General Comments: Pt is A and cooperative but has slow processing. Supportive daughter present throughout and did discuss possibility of DC directly home from acute hospital.               Pertinent Vitals/Pain Pain Assessment Pain Assessment: 0-10 Pain Score: 3  Pain Location: L hip Pain Descriptors / Indicators: Discomfort Pain Intervention(s): Limited activity within patient's tolerance, Monitored during session, Premedicated before session, Repositioned     PT Goals (current goals can now be found in the care plan section) Acute Rehab PT Goals Patient Stated Goal: " Get better and go home." Progress towards PT goals: Progressing toward goals    Frequency    BID      PT Plan Current plan remains appropriate       AM-PAC PT "6 Clicks" Mobility   Outcome Measure  Help needed turning from your back to your side while in a flat bed without using bedrails?: A Little Help needed moving from lying on your back to sitting on the side of a flat bed without using bedrails?: A Lot Help needed moving to and from a bed to a chair (including a wheelchair)?: A Lot Help needed standing up from a chair using your arms (e.g., wheelchair or bedside chair)?: A Lot Help needed to walk in hospital room?: A Little Help needed climbing 3-5 steps with a railing? : A Lot 6 Click Score: 14  End of Session Equipment Utilized During Treatment: Gait belt Activity Tolerance: Patient tolerated treatment well Patient left: in bed;with call  bell/phone within reach;with bed alarm set Nurse Communication: Mobility status PT Visit Diagnosis: Muscle weakness (generalized) (M62.81);Difficulty in walking, not elsewhere classified (R26.2);Pain Pain - Right/Left: Left Pain - part of body: Hip     Time: 1050-1108 PT Time Calculation (min) (ACUTE ONLY): 18 min  Charges:    $Gait Training: 8-22 mins PT General Charges $$ ACUTE PT VISIT: 1 Visit                     Jetta Lout PTA 07/23/23, 3:06 PM

## 2023-07-23 NOTE — Progress Notes (Signed)
  Subjective: 4 Days Post-Op Procedure(s) (LRB): ARTHROPLASTY BIPOLAR HIP (HEMIARTHROPLASTY) (Left) Patient reports pain as mild in bed this morning. Patient is well, current plan is for discharge to SNF pending continued progress with therapy. Has improved with PT over the past few days.  Walked 100 feet yesterday. Negative for chest pain and shortness of breath Fever: no Gastrointestinal:Negative for nausea and vomiting Has not had a BM yet.  Objective: Vital signs in last 24 hours: Temp:  [98.1 F (36.7 C)-98.2 F (36.8 C)] 98.1 F (36.7 C) (08/02 2353) Pulse Rate:  [59-75] 59 (08/02 2353) Resp:  [16-17] 17 (08/02 2353) BP: (116-182)/(69-87) 116/71 (08/02 2353) SpO2:  [97 %-99 %] 98 % (08/02 2353)  Intake/Output from previous day:  Intake/Output Summary (Last 24 hours) at 07/23/2023 0843 Last data filed at 07/22/2023 1923 Gross per 24 hour  Intake 400 ml  Output --  Net 400 ml    Intake/Output this shift: No intake/output data recorded.  Labs: Recent Labs    07/21/23 0530 07/22/23 0547  HGB 8.8* 8.1*   Recent Labs    07/21/23 0530 07/22/23 0547  WBC 14.8* 12.7*  RBC 2.68* 2.50*  HCT 24.3* 22.8*  PLT 234 224   Recent Labs    07/21/23 0530 07/22/23 0547  NA 127* 126*  K 4.6  --   CL 97*  --   CO2 21*  --   BUN 22  --   CREATININE 0.77  --   GLUCOSE 121*  --   CALCIUM 9.1  --    No results for input(s): "LABPT", "INR" in the last 72 hours.    EXAM General - Patient is Alert and Appropriate.  Daughter at bedside. Extremity - ABD soft Neurovascular intact Dorsiflexion/Plantar flexion intact No cellulitis present Compartment soft Dressing/Incision - Mild bloody drainage noted to the left hip honeycomb dressing. Motor Function - intact, moving foot and toes well on exam.  ABdomen soft with intact bowel sounds this morning.  Past Medical History:  Diagnosis Date   Abdominal hernia    Anxiety    CAD (coronary artery disease)    HLD  (hyperlipidemia)    Hypertension     Assessment/Plan: 4 Days Post-Op Procedure(s) (LRB): ARTHROPLASTY BIPOLAR HIP (HEMIARTHROPLASTY) (Left) Principal Problem:   Closed fracture of left hip (HCC) Active Problems:   Chronic hyponatremia   CAD (coronary artery disease)   Hypertension   Fall at home, initial encounter   New onset atrial fibrillation (HCC)   Diarrhea   Hypertensive urgency   Malnutrition of moderate degree  Estimated body mass index is 21.97 kg/m as calculated from the following:   Height as of this encounter: 5\' 5"  (1.651 m).   Weight as of this encounter: 59.9 kg. Advance diet Up with therapy D/C IV fluids when tolerating po intake.  Labs reviewed this AM. WBC 12.7, Hg 8.1 yesterday. Up with therapy today.  Current plan is for discharge to [facility] on Monday. Continue to work on BM.  She is passing gas this morning.  Added lactulose  Following discharge, follow-up with [facility] in 10-14 days for staple removal. Continue Eliquis for two weeks following discharge.  DVT Prophylaxis - TED hose and Eliquis Weight-Bearing as tolerated to left leg  J. Horris Latino, PA-C [facility] Orthopaedic Surgery 07/23/2023, 8:43 AM

## 2023-07-23 NOTE — Progress Notes (Addendum)
Progress Note    [name]  ZHY:[id number] DOB: 08-05-1930  DOA: 07/19/2023 PCP: Marguarite Arbour, MD      Brief Narrative:    Medical records reviewed and are as summarized below:  Kelsey Martin is a 89 y.o. female  with medical history significant of CAD, HLD, HTN, anxiety, chronic hyponatremia (120-130), who presented to the hospital with left hip pain following a fall.  Reportedly, she had had multiple falls at home.  She was found to have displaced left femoral neck fracture.  She underwent left hip bipolar hemiarthroplasty on 07/19/2023.  She was treated with analgesics.      Assessment/Plan:   Principal Problem:   Closed fracture of left hip [facility]) Active Problems:   Fall at home, initial encounter   New onset atrial fibrillation (HCC)   CAD (coronary artery disease)   Hypertension   Hypertensive urgency   Chronic hyponatremia   Diarrhea   Malnutrition of moderate degree   Nutrition Problem: Moderate Malnutrition Etiology: chronic illness  Signs/Symptoms: mild fat depletion, moderate fat depletion, mild muscle depletion, moderate muscle depletion   Body mass index is 21.97 kg/m.   Close left hip (femoral neck) fracture, s/p fall at home: S/p left hip bipolar hemiarthroplasty on 07/19/2023. Continue PT and OT.  Analgesics as needed for pain.   Acute blood loss anemia: Hemoglobin is down to 8.1.  She is asymptomatic.  Hemoglobin was 12.5 on admission.  No indication for blood transfusion at this time.   Leukocytosis: This is probably reactive.  Improving. Repeat CBC tomorrow   Atrial fibrillation, new onset: Continue bisoprolol and Eliquis. CHA2DS2-VASc score is 6. 2D echo showed EF estimated at 60 to 65%, indeterminate LV diastolic parameters.   Hypomagnesemia and hypokalemia: Improved   CAD: Continue Eliquis.  Aspirin has been discontinued.  Discussed with cardiology team   Hypertension: Continue antihypertensives   Chronic  hyponatremia: Asymptomatic  Diet Order             Diet regular Fluid consistency: Thin  Diet effective now                            Consultants: Orthopedic surgeon Cardiologist  Procedures: Left hip bipolar hemiarthroplasty    Medications:    apixaban  2.5 mg Oral BID   atorvastatin  20 mg Oral QHS   benazepril  40 mg Oral Daily   bisoprolol  5 mg Oral Daily   brimonidine  1 drop Left Eye q morning   cloNIDine  0.2 mg Transdermal Weekly   docusate sodium  100 mg Oral BID   dorzolamide-timolol  1 drop Both Eyes BID   feeding supplement  237 mL Oral BID BM   hydrALAZINE  50 mg Oral QID   isosorbide mononitrate  60 mg Oral Daily   latanoprost  1 drop Both Eyes QHS   multivitamin with minerals  1 tablet Oral Daily   Continuous Infusions:     Anti-infectives (From admission, onward)    Start     Dose/Rate Route Frequency Ordered Stop   07/19/23 2000  ceFAZolin (ANCEF) IVPB 2g/100 mL premix        2 g 200 mL/hr over 30 Minutes Intravenous Every 8 hours 07/19/23 1900 07/20/23 1916   07/19/23 1300  ceFAZolin (ANCEF) IVPB 2g/100 mL premix        2 g 200 mL/hr over 30 Minutes Intravenous 30 min pre-op 07/19/23 1046  07/20/23 0853              Family Communication/Anticipated D/C date and plan/Code Status   DVT prophylaxis: apixaban (ELIQUIS) tablet 2.5 mg Start: 07/20/23 1000 SCDs Start: 07/19/23 1901 Place TED hose Start: 07/19/23 1901 apixaban (ELIQUIS) tablet 2.5 mg     Code Status: DNR  Family Communication: Plan discussed with Simone,daughter, at the bedside  Disposition Plan: Plan to discharge to SNF   Status is: Inpatient Remains inpatient appropriate because: S/p left hip surgery       Subjective:   No complaints.  Pain in left hip is better.  No dizziness, shortness of breath. Kelsey Martin, daughter, was at the bedside  Objective:    Vitals:   07/22/23 2353 07/23/23 0829 07/23/23 0841 07/23/23 1529  BP: 116/71 (!)  190/85 134/70 128/69  Pulse: (!) 59 82 78 85  Resp: 17 16 16 17   Temp: 98.1 F (36.7 C) 98.1 F (36.7 C) 98 F (36.7 C) 98.1 F (36.7 C)  TempSrc: Oral Oral Oral   SpO2: 98% 98% 100% 99%  Weight:      Height:       No data found.   Intake/Output Summary (Last 24 hours) at 07/23/2023 1551 Last data filed at 07/23/2023 1028 Gross per 24 hour  Intake 200 ml  Output --  Net 200 ml   Filed Weights   07/19/23 1341  Weight: 59.9 kg    Exam:  GEN: NAD SKIN: No rash EYES: EOMI ENT: MMM CV: RRR PULM: CTA B ABD: soft, ND, NT, +BS CNS: AAO x 3, non focal EXT: No edema or tenderness.  Dressing on left hip surgical wound is clean, dry and intact     Data Reviewed:   I have personally reviewed following labs and imaging studies:  Labs: Labs show the following:   Basic Metabolic Panel: Recent Labs  Lab 07/19/23 0957 07/19/23 1233 07/19/23 2302 07/20/23 0537 07/20/23 0716 07/21/23 0530 07/22/23 0547  NA 127* 126* 127*  --  129* 127* 126*  K 3.5 3.5 3.2*  --  3.5 4.6  --   CL 93* 93* 94*  --  97* 97*  --   CO2 23 24 22   --  21* 21*  --   GLUCOSE 115* 119* 220*  --  140* 121*  --   BUN 9 9 11   --  16 22  --   CREATININE 0.56 0.69 0.81  --  0.85 0.77  --   CALCIUM 9.7 9.6 9.2  --  9.3 9.1  --   MG  --   --   --  1.6*  --  2.2  --    GFR Estimated Creatinine Clearance: 42.1 mL/min (by C-G formula based on SCr of 0.77 mg/dL). Liver Function Tests: Recent Labs  Lab 07/19/23 0957  AST 25  ALT 16  ALKPHOS 49  BILITOT 1.2  PROT 6.8  ALBUMIN 3.9   No results for input(s): "LIPASE", "AMYLASE" in the last 168 hours. No results for input(s): "AMMONIA" in the last 168 hours. Coagulation profile Recent Labs  Lab 07/19/23 1132  INR 1.1    CBC: Recent Labs  Lab 07/19/23 0957 07/20/23 0537 07/21/23 0530 07/22/23 0547  WBC 10.6* 13.6* 14.8* 12.7*  NEUTROABS  --   --   --  10.3*  HGB 12.5 10.5* 8.8* 8.1*  HCT 35.1* 29.2* 24.3* 22.8*  MCV 91.6 90.1 90.7  91.2  PLT 240 241 234 224   Cardiac Enzymes: Recent  Labs  Lab 07/19/23 0957  CKTOTAL 43   BNP (last 3 results) No results for input(s): "PROBNP" in the last 8760 hours. CBG: No results for input(s): "GLUCAP" in the last 168 hours. D-Dimer: No results for input(s): "DDIMER" in the last 72 hours. Hgb A1c: No results for input(s): "HGBA1C" in the last 72 hours. Lipid Profile: No results for input(s): "CHOL", "HDL", "LDLCALC", "TRIG", "CHOLHDL", "LDLDIRECT" in the last 72 hours. Thyroid function studies: No results for input(s): "TSH", "T4TOTAL", "T3FREE", "THYROIDAB" in the last 72 hours.  Invalid input(s): "FREET3"  Anemia work up: No results for input(s): "VITAMINB12", "FOLATE", "FERRITIN", "TIBC", "IRON", "RETICCTPCT" in the last 72 hours. Sepsis Labs: Recent Labs  Lab 07/19/23 0957 07/20/23 0537 07/21/23 0530 07/22/23 0547  WBC 10.6* 13.6* 14.8* 12.7*    Microbiology Recent Results (from the past 240 hour(s))  Surgical PCR screen     Status: None   Collection Time: 07/19/23  9:57 AM   Specimen: Nasal Mucosa; Nasal Swab  Result Value Ref Range Status   MRSA, PCR NEGATIVE NEGATIVE Final   Staphylococcus aureus NEGATIVE NEGATIVE Final    Comment: (NOTE) The Xpert SA Assay (FDA approved for NASAL specimens in patients 62 years of age and older), is one component of a comprehensive surveillance program. It is not intended to diagnose infection nor to guide or monitor treatment. Performed at [facility], [address] [place], [place] [address]     Procedures and diagnostic studies:  No results found.             LOS: 4 days      [facility]   Pager on [email or web address]. If 7PM-7AM, please contact night-coverage at [email or web address]     07/23/2023, 3:51 PM

## 2023-07-23 NOTE — Progress Notes (Signed)
Physical Therapy Treatment Patient Details Name: Kelsey Martin MRN: [id number] DOB: 1930-11-26 Today's Date: 07/23/2023   History of Present Illness 89 y/o female s/p fall at home.  Suffered L hip fracture and is now s/p total hip replacement (posterior approach) 7/30.    PT Comments  Pt was sitting in recliner upon arrival. She endorses need to have BM. Was able to stand and take steps to [facility] fairly easily. Small successful BM prior to ambulation into hallway ~ 100 ft total. Returned to room and requested to return to supine for nap. Pt overall is progressing well. DC recs remain appropriate however if pt continues to progress quickly, recommend updating DC recs. Acute PT will continue to follow and progress per current POC.    If plan is discharge home, recommend the following: Assistance with cooking/housework;Direct supervision/assist for medications management;Direct supervision/assist for financial management;Assist for transportation;Help with stairs or ramp for entrance;A little help with walking and/or transfers;A little help with bathing/dressing/bathroom     Equipment Recommendations  Rolling walker (2 wheels);BSC/3in1       Precautions / Restrictions Precautions Precautions: Posterior Hip;Fall Precaution Booklet Issued: Yes (comment) Restrictions Weight Bearing Restrictions: Yes RLE Weight Bearing: Weight bearing as tolerated     Mobility  Bed Mobility Overal bed mobility: Needs Assistance Bed Mobility: Sit to Supine Sidelying to sit: Min assist Supine to sit: Min assist, Mod assist Sit to supine: Min assist, Mod assist   Transfers Overall transfer level: Needs assistance Equipment used: Rolling walker (2 wheels) Transfers: Sit to/from Stand Sit to Stand: Min assist   Ambulation/Gait Ambulation/Gait assistance: Min guard Gait Distance (Feet): 100 Feet Assistive device: Rolling walker (2 wheels) Gait Pattern/deviations: Step-to pattern, Antalgic Gait velocity:  decreased  General Gait Details: Pt was able to ambulate ~100 ft with RW but does endorse slightly morepain than AM session.   Balance Overall balance assessment: Needs assistance Sitting-balance support: Feet supported Sitting balance-Leahy Scale: Good     Standing balance support: Bilateral upper extremity supported Standing balance-Leahy Scale: Poor Standing balance comment: reliant on BUE support for dynamic standing activity       Cognition Arousal/Alertness: Awake/alert Behavior During Therapy: WFL for tasks assessed/performed Overall Cognitive Status: Within Functional Limits for tasks assessed    General Comments: Pt is A and cooperative but has slow processing. Supportive daughter present throughout and did discuss possibility of DC directly home from acute hospital.               Pertinent Vitals/Pain Pain Assessment Pain Assessment: 0-10 Pain Score: 3  Pain Location: L hip Pain Descriptors / Indicators: Discomfort Pain Intervention(s): Limited activity within patient's tolerance, Monitored during session, Premedicated before session, Repositioned     PT Goals (current goals can now be found in the care plan section) Acute Rehab PT Goals Patient Stated Goal: " Get better and go home." Progress towards PT goals: Progressing toward goals    Frequency    BID      PT Plan Current plan remains appropriate       AM-PAC PT "6 Clicks" Mobility   Outcome Measure  Help needed turning from your back to your side while in a flat bed without using bedrails?: A Little Help needed moving from lying on your back to sitting on the side of a flat bed without using bedrails?: A Little Help needed moving to and from a bed to a chair (including a wheelchair)?: A Little Help needed standing up from a chair using your arms (  e.g., wheelchair or bedside chair)?: A Little Help needed to walk in hospital room?: A Little Help needed climbing 3-5 steps with a railing? : A  Lot 6 Click Score: 17    End of Session Equipment Utilized During Treatment: Gait belt Activity Tolerance: Patient tolerated treatment well Patient left: in bed;with call bell/phone within reach;with bed alarm set Nurse Communication: Mobility status PT Visit Diagnosis: Muscle weakness (generalized) (M62.81);Difficulty in walking, not elsewhere classified (R26.2);Pain Pain - Right/Left: Left Pain - part of body: Hip     Time: 1401-1420 PT Time Calculation (min) (ACUTE ONLY): 19 min  Charges:    $Gait Training: 8-22 mins PT General Charges $$ ACUTE PT VISIT: 1 Visit                     Jetta Lout PTA 07/23/23, 3:16 PM

## 2023-07-23 NOTE — Plan of Care (Signed)

## 2023-07-24 DIAGNOSIS — S72002A Fracture of unspecified part of neck of left femur, initial encounter for closed fracture: Secondary | ICD-10-CM | POA: Diagnosis not present

## 2023-07-24 LAB — BASIC METABOLIC PANEL WITH GFR
Anion gap: 4 — ABNORMAL LOW (ref 5–15)
BUN: 27 mg/dL — ABNORMAL HIGH (ref 8–23)
CO2: 25 mmol/L (ref 22–32)
Calcium: 9.2 mg/dL (ref 8.9–10.3)
Chloride: 96 mmol/L — ABNORMAL LOW (ref 98–111)
Creatinine, Ser: 0.79 mg/dL (ref 0.44–1.00)
GFR, Estimated: >60 mL/min (ref >60)
Glucose, Bld: 121 mg/dL — ABNORMAL HIGH (ref 70–99)
Potassium: 4.4 mmol/L (ref 3.5–5.1)
Sodium: 125 mmol/L — ABNORMAL LOW (ref 135–145)

## 2023-07-24 LAB — CBC WITH DIFFERENTIAL/PLATELET
Abs Immature Granulocytes: 0.11 10*3/uL — ABNORMAL HIGH (ref 0.00–0.07)
Basophils Absolute: 0 10*3/uL (ref 0.0–0.1)
Basophils Relative: 0 %
Eosinophils Absolute: 0 10*3/uL (ref 0.0–0.5)
Eosinophils Relative: 0 %
HCT: 22.5 % — ABNORMAL LOW (ref 36.0–46.0)
Hemoglobin: 8.1 g/dL — ABNORMAL LOW (ref 12.0–15.0)
Immature Granulocytes: 1 %
Lymphocytes Relative: 16 %
Lymphs Abs: 1.7 10*3/uL (ref 0.7–4.0)
MCH: 32.7 pg (ref 26.0–34.0)
MCHC: 36 g/dL (ref 30.0–36.0)
MCV: 90.7 fL (ref 80.0–100.0)
Monocytes Absolute: 1.1 10*3/uL — ABNORMAL HIGH (ref 0.1–1.0)
Monocytes Relative: 10 %
Neutro Abs: 8 10*3/uL — ABNORMAL HIGH (ref 1.7–7.7)
Neutrophils Relative %: 73 %
Platelets: 253 10*3/uL (ref 150–400)
RBC: 2.48 MIL/uL — ABNORMAL LOW (ref 3.87–5.11)
RDW: 12.6 % (ref 11.5–15.5)
WBC: 10.9 10*3/uL — ABNORMAL HIGH (ref 4.0–10.5)
nRBC: 0 % (ref 0.0–0.2)

## 2023-07-24 MED ORDER — BRIMONIDINE TARTRATE 0.2 % OP SOLN: 1.0000 [drp] | Freq: Two times a day (BID) | OPHTHALMIC | Status: DC

## 2023-07-24 MED ORDER — HYDRALAZINE HCL 25 MG PO TABS: 25.0000 mg | ORAL_TABLET | Freq: Four times a day (QID) | ORAL | Status: DC | PRN

## 2023-07-24 MED ORDER — HYDRALAZINE HCL 50 MG PO TABS: 100.0000 mg | ORAL_TABLET | Freq: Two times a day (BID) | ORAL | Status: DC

## 2023-07-24 MED ORDER — SODIUM CHLORIDE 1 G PO TABS
2.0000 g | ORAL_TABLET | Freq: Three times a day (TID) | ORAL | Status: DC
  Administered 2023-07-24: 2 g via ORAL
  Filled 2023-07-24: qty 2

## 2023-07-24 MED ORDER — FERROUS SULFATE 325 (65 FE) MG PO TABS
325.0000 mg | ORAL_TABLET | ORAL | Status: DC
  Administered 2023-07-24: 325 mg via ORAL
  Filled 2023-07-24: qty 1

## 2023-07-24 MED ORDER — FERROUS SULFATE 325 (65 FE) MG PO TABS: 325.0000 mg | ORAL_TABLET | ORAL | 0 refills | Status: DC

## 2023-07-24 NOTE — Progress Notes (Addendum)
Physical Therapy Treatment Patient Details Name: Kelsey Martin MRN: [id number] DOB: [birth year] Today's Date: 07/24/2023   History of Present Illness 89 y/o female s/p fall at home.  Suffered L hip fracture and is now s/p total hip replacement (posterior approach) 7/30.    PT Comments  Pt ready for session.  Pre-medicated.  She transitions to EOB with light assist.  Steady in sitting.  She is able to stand with light min a x 1.  Walks 100' x 1 and 60' x 1 with RW and slow but steady gait.  Continues to need +1 assist for general safety.  Walks up/down 3 steps with bilateral rails.  Min guard for safety and cues as she does step over step pattern if not cues.  Family member Victorino Dike in for session.  Will need to confirm if pt has RW at home as both are unsure.  Discussed with team to follow up with discharge planning as they may wish to go home today if it can be arranged.  Does not was SNF at this time.  Pt is given a gait belt to increase safety at home.   If plan is discharge home, recommend the following: Assistance with cooking/housework;Direct supervision/assist for medications management;Direct supervision/assist for financial management;Assist for transportation;Help with stairs or ramp for entrance;A little help with walking and/or transfers;A little help with bathing/dressing/bathroom   Can travel by private vehicle        Equipment Recommendations  Rolling walker (2 wheels)    Recommendations for Other Services       Precautions / Restrictions Precautions Precautions: Posterior Hip;Fall Precaution Booklet Issued: Yes (comment) Restrictions Weight Bearing Restrictions: Yes RLE Weight Bearing: Weight bearing as tolerated     Mobility  Bed Mobility Overal bed mobility: Needs Assistance Bed Mobility: Sit to Supine     Supine to sit: Min assist, Min guard       Patient Response: Cooperative  Transfers Overall transfer level: Needs assistance Equipment used: Rolling  walker (2 wheels) Transfers: Sit to/from Stand Sit to Stand: Min assist                Ambulation/Gait Ambulation/Gait assistance: Min guard Gait Distance (Feet): 100 Feet Assistive device: Rolling walker (2 wheels) Gait Pattern/deviations: Step-to pattern, Antalgic Gait velocity: decreased     General Gait Details: 100' and 60'   Stairs Stairs: Yes Stairs assistance: Min guard, Min assist Stair Management: Two rails, Step to pattern Number of Stairs: 3 General stair comments: cues for sequencing   Wheelchair Mobility     Tilt Bed Tilt Bed Patient Response: Cooperative  Modified Rankin (Stroke Patients Only)       Balance Overall balance assessment: Needs assistance Sitting-balance support: Feet supported Sitting balance-Leahy Scale: Good     Standing balance support: Bilateral upper extremity supported Standing balance-Leahy Scale: Fair Standing balance comment: reliant on BUE support for dynamic standing activity                            Cognition Arousal/Alertness: Awake/alert Behavior During Therapy: WFL for tasks assessed/performed Overall Cognitive Status: Within Functional Limits for tasks assessed                                          Exercises      General Comments        Pertinent  Vitals/Pain Pain Assessment Pain Assessment: Faces Faces Pain Scale: Hurts little more Pain Location: L hip Pain Descriptors / Indicators: Discomfort Pain Intervention(s): Limited activity within patient's tolerance, Monitored during session, Premedicated before session, Repositioned    Home Living                          Prior Function            PT Goals (current goals can now be found in the care plan section) Progress towards PT goals: Progressing toward goals    Frequency    BID      PT Plan Current plan remains appropriate    Co-evaluation              AM-PAC PT "6 Clicks"  Mobility   Outcome Measure  Help needed turning from your back to your side while in a flat bed without using bedrails?: A Little Help needed moving from lying on your back to sitting on the side of a flat bed without using bedrails?: A Little Help needed moving to and from a bed to a chair (including a wheelchair)?: A Little Help needed standing up from a chair using your arms (e.g., wheelchair or bedside chair)?: A Little Help needed to walk in hospital room?: A Little Help needed climbing 3-5 steps with a railing? : A Little 6 Click Score: 18    End of Session   Activity Tolerance: Patient tolerated treatment well Patient left: in bed;with call bell/phone within reach;with bed alarm set Nurse Communication: Mobility status PT Visit Diagnosis: Muscle weakness (generalized) (M62.81);Difficulty in walking, not elsewhere classified (R26.2);Pain Pain - Right/Left: Left Pain - part of body: Hip     Time: 0981-1914 PT Time Calculation (min) (ACUTE ONLY): 27 min  Charges:    $Gait Training: 8-22 mins PT General Charges $$ ACUTE PT VISIT: 1 Visit                   Danielle Dess, PTA 07/24/23, 12:24 PM

## 2023-07-24 NOTE — Discharge Summary (Signed)
Physician Discharge Summary   Patient: Kelsey Martin MRN: [id number] DOB: 16-Mar-1930  Admit date:     07/19/2023  Discharge date: 07/24/23  Discharge Physician: Lurene Shadow   PCP: Marguarite Arbour, MD   Recommendations at discharge:  {Tip this will not be part of the note when signed- Example include specific recommendations for outpatient follow-up, pending tests to follow-up on. (Optional):26781} Follow-up with PCP in 1 to 2 weeks Follow-up with orthopedic surgeon in 10 days  Discharge Diagnoses: Principal Problem:   Closed fracture of left hip [facility]) Active Problems:   Fall at home, initial encounter   New onset atrial fibrillation (HCC)   CAD (coronary artery disease)   Hypertension   Hypertensive urgency   Chronic hyponatremia   Diarrhea   Malnutrition of moderate degree  Resolved Problems:   * No resolved hospital problems. *  Hospital Course:  Kelsey Martin is a 89 y.o. female  with medical history significant of CAD, HLD, HTN, anxiety, chronic hyponatremia (120-130), who presented to the hospital with left hip pain following a fall.  Reportedly, she had had multiple falls at home.   She was found to have displaced left femoral neck fracture.  She underwent left hip bipolar hemiarthroplasty on 07/19/2023.  She was treated with analgesics.      Assessment and Plan:   Close left hip (femoral neck) fracture, s/p fall at home: S/p left hip bipolar hemiarthroplasty on 07/19/2023. PT and OT recommended discharge to SNF.  However, patient and her family declined SNF.  Patient prefers to go home with home health therapy.     Acute blood loss anemia: Hemoglobin is down to 8.1.  She is asymptomatic.  Hemoglobin was 12.5 on admission.  No indication for blood transfusion.  She will be discharged on oral ferrous sulfate.   Leukocytosis: Improved     Atrial fibrillation, new onset: Continue bisoprolol and Eliquis. CHA2DS2-VASc score is 6. 2D echo showed EF estimated at  60 to 65%, indeterminate LV diastolic parameters.     Hypomagnesemia and hypokalemia: Improved     CAD: Continue Eliquis.  Aspirin has been discontinued.  Discussed with cardiology team     Hypertension: Continue antihypertensives     Chronic hyponatremia: Asymptomatic.  Sodium level is around her baseline.  Similarly, daughter, said that patient normally drinks Gatorade every day and that tends to keep her sodium level at baseline.    Patient's condition has improved and she is deemed stable for discharge to home today.  Discharge plan was discussed with Kelsey Martin (daughter) over the phone and Kelsey Martin (family friend) at the bedside Kelsey Martin understands that patient is at increased risk for falls because of advanced age and debility.  She prefers that patient  be discharged home.    {Tip this will not be part of the note when signed Body mass index is 21.97 kg/m. ,  Nutrition Documentation    Flowsheet Row ED to Hosp-Admission (Current) from 07/19/2023 in [facility] (1A)  Nutrition Problem Moderate Malnutrition  Etiology chronic illness  Nutrition Goal Patient will meet greater than or equal to 90% of their needs  Interventions Ensure Enlive (each supplement provides 350kcal and 20 grams of protein), MVI     ,  (Optional):26781}   Consultants: Orthopedic surgeon Procedures performed: Left hip bipolar hemiarthroplasty Disposition: Home health Diet recommendation:  Discharge Diet Orders (From admission, onward)     Start     Ordered   07/24/23 0000  Diet - low  sodium heart healthy        07/24/23 1332           Regular diet DISCHARGE MEDICATION: Allergies as of 07/24/2023       Reactions   Prozac [fluoxetine] Other (See Comments)   Severe lethargy - seems to apply to all antidepressants    Amlodipine Swelling        Medication List     STOP taking these medications    aspirin EC 81 MG tablet   HYDROcodone-acetaminophen  5-325 MG tablet Commonly known as: NORCO/VICODIN   niacin 500 MG tablet Commonly known as: VITAMIN B3   ondansetron 4 MG disintegrating tablet Commonly known as: ZOFRAN-ODT       TAKE these medications    ALPRAZolam 0.5 MG tablet Commonly known as: XANAX Take 0.5 mg by mouth at bedtime as needed for sleep.   apixaban 2.5 MG Tabs tablet Commonly known as: ELIQUIS Take 1 tablet (2.5 mg total) by mouth 2 (two) times daily.   atorvastatin 20 MG tablet Commonly known as: LIPITOR Take 20 mg by mouth daily.   benazepril 20 MG tablet Commonly known as: LOTENSIN Take 20 mg by mouth 2 (two) times a day.   bisoprolol 5 MG tablet Commonly known as: ZEBETA Take 5 mg by mouth daily.   brimonidine 0.2 % ophthalmic solution Commonly known as: ALPHAGAN Place 1 drop into the left eye 2 (two) times daily. What changed: when to take this   cloNIDine 0.2 mg/24hr patch Commonly known as: CATAPRES - Dosed in mg/24 hr Place 0.2 mg onto the skin once a week.   dorzolamide-timolol 2-0.5 % ophthalmic solution Commonly known as: COSOPT Place 1 drop into both eyes 2 (two) times a day.   ferrous sulfate 325 (65 FE) MG tablet Take 1 tablet (325 mg total) by mouth every other day. Start taking on: July 26, 2023   hydrALAZINE 50 MG tablet Commonly known as: APRESOLINE Take 2 tablets (100 mg total) by mouth 2 (two) times daily. What changed:  how much to take when to take this   isosorbide mononitrate 60 MG 24 hr tablet Commonly known as: IMDUR Take 60 mg by mouth daily.   Lumigan 0.01 % Soln Generic drug: bimatoprost Place 1 drop into both eyes at bedtime.   ondansetron 4 MG tablet Commonly known as: ZOFRAN Take 1 tablet (4 mg total) by mouth every 6 (six) hours as needed for nausea.   oxyCODONE-acetaminophen 5-325 MG tablet Commonly known as: PERCOCET/ROXICET Take 1 tablet by mouth every 4 (four) hours as needed for moderate pain.               Durable Medical  Equipment  (From admission, onward)           Start     Ordered   07/24/23 0000  For home use only DME Walker rolling       Question Answer Comment  Walker: With 5 Inch Wheels   Patient needs a walker to treat with the following condition Left hip pain      07/24/23 1332              Discharge Care Instructions  (From admission, onward)           Start     Ordered   07/24/23 0000  Discharge wound care:       Comments: Follow-up with orthopedic surgeon in 10 to 14 days for wound care   07/24/23 1332  Contact information for follow-up providers     Kelsey Martin, Alexander, MD. Go in 1 week(s).   Specialty: Cardiology Contact information: [address] [facility] [place] [address] [phone]         Kelsey Oregon, PA-C Follow up in 14 day(s).   Specialty: Physician Assistant Why: Mindi Slicker information: [address] [place] [address] [phone]              Contact information for after-discharge care     Destination     [facility] Preferred SNF .   Service: Skilled Nursing Contact information: [address] [place] [address] [phone]                    Discharge Exam: Ceasar Mons Weights   07/19/23 1341  Weight: 59.9 kg   GEN: NAD SKIN: Warm and dry EYES: EOMI ENT: MMM CV: RRR PULM: CTA B ABD: soft, ND, NT, +BS CNS: AAO x 3, non focal EXT: No edema or tenderness.  Left hip surgical wound is clean, dry and intact   Condition at discharge: good  The results of significant diagnostics from this hospitalization (including imaging, microbiology, ancillary and laboratory) are listed below for reference.   Imaging Studies: ECHOCARDIOGRAM COMPLETE  Result Date: 07/20/2023    ECHOCARDIOGRAM REPORT   Patient Name:   OUMOU SMEAD Date of Exam: 07/19/2023  Medical Rec #:  [id number]      Height:       65.0 in Accession #:    [id number]     Weight:       132.0 lb Date of Birth:  [birth year]      BSA:          1.658 m Patient Age:    89 years       BP:           215/188 mmHg Patient Gender: F              HR:           60 bpm. Exam Location:  [facility] Procedure: 2D Echo, Cardiac Doppler and Color Doppler Indications:     I48.91 Atrial Fibrillation  History:         Patient has no prior history of Echocardiogram examinations.                  CAD; Risk Factors:Hypertension and Dyslipidemia.  Sonographer:     Kelsey Martin RDCS Referring Phys:  [id number] Kelsey Martin Diagnosing Phys: Kelsey Millard MD IMPRESSIONS  1. Left ventricular ejection fraction, by estimation, is 60 to 65%. The left ventricle has normal function. The left ventricle has no regional wall motion abnormalities. Indeterminate diastolic filling due to E-A fusion.  2. Right ventricular systolic function is normal. The right ventricular size is normal.  3. The mitral valve is normal in structure. No evidence of mitral valve regurgitation. No evidence of mitral stenosis.  4. The aortic valve is normal in structure. Aortic valve regurgitation is trivial. No aortic stenosis is present.  5. The inferior vena cava is normal in size with greater than 50% respiratory variability, suggesting right atrial pressure of 3 mmHg. FINDINGS  Left Ventricle: Left ventricular ejection fraction, by estimation, is 60 to 65%. The left ventricle has normal function. The left ventricle has no regional wall motion abnormalities. The left ventricular internal cavity size was normal in size.  There is  no left ventricular hypertrophy. Indeterminate diastolic filling due to E-A fusion. Right Ventricle: The right ventricular size is normal. No increase in right ventricular wall thickness. Right ventricular systolic function is normal. Left Atrium: Left atrial size was normal in size. Right Atrium: Right atrial size was  normal in size. Pericardium: There is no evidence of pericardial effusion. Mitral Valve: The mitral valve is normal in structure. No evidence of mitral valve regurgitation. No evidence of mitral valve stenosis. Tricuspid Valve: The tricuspid valve is normal in structure. Tricuspid valve regurgitation is not demonstrated. No evidence of tricuspid stenosis. Aortic Valve: The aortic valve is normal in structure. Aortic valve regurgitation is trivial. No aortic stenosis is present. Pulmonic Valve: The pulmonic valve was normal in structure. Pulmonic valve regurgitation is not visualized. No evidence of pulmonic stenosis. Aorta: The aortic root is normal in size and structure. Venous: The inferior vena cava is normal in size with greater than 50% respiratory variability, suggesting right atrial pressure of 3 mmHg. IAS/Shunts: No atrial level shunt detected by color flow Doppler.  LEFT VENTRICLE PLAX 2D LVIDd:         3.80 cm LVIDs:         2.60 cm LV PW:         1.10 cm LV IVS:        1.10 cm  RIGHT VENTRICLE             IVC RV Basal diam:  3.70 cm     IVC diam: 1.20 cm RV S prime:     11.40 cm/s TAPSE (M-mode): 1.7 cm LEFT ATRIUM             Index        RIGHT ATRIUM           Index LA diam:        3.80 cm 2.29 cm/m   RA Area:     14.50 cm LA Vol (A2C):   40.1 ml 24.19 ml/m  RA Volume:   35.70 ml  21.53 ml/m LA Vol (A4C):   53.4 ml 32.21 ml/m LA Biplane Vol: 47.3 ml 28.53 ml/m  AORTIC VALVE LVOT Vmax:   92.55 cm/s LVOT Vmean:  59.200 cm/s LVOT VTI:    0.170 m  AORTA Ao Root diam: 3.20 cm MV E velocity: 102.35 cm/s                             SHUNTS                             Systemic VTI: 0.17 m Kelsey Millard MD Electronically signed by Kelsey Millard MD Signature Date/Time: 07/20/2023/7:39:50 AM    Final    MR BRAIN WO CONTRAST  Result Date: 07/20/2023 CLINICAL DATA:  Initial evaluation for neuro deficit, stroke suspected. EXAM: MRI HEAD WITHOUT CONTRAST TECHNIQUE: Multiplanar, multiecho pulse  sequences of the brain and surrounding structures were obtained without intravenous contrast. COMPARISON:  Prior study from earlier the same day. FINDINGS: Brain: Diffuse prominence of the CSF containing spaces compatible generalized age-related cerebral atrophy. Chronic infarct noted at the right parietal lobe. Few small remote lacunar infarcts noted about the right basal ganglia and bilateral thalami. Few tiny remote bilateral cerebellar infarcts noted. Underlying mild chronic microvascular ischemic disease for age. No evidence for acute or subacute ischemia. Gray-white matter differentiation maintained. No acute intracranial hemorrhage. Few  punctate chronic micro hemorrhages noted, likely small vessel related. No mass lesion, midline shift or mass effect. No hydrocephalus or extra-axial fluid collection. Pituitary gland is suprasellar region within normal limits. Vascular: Major intracranial vascular flow voids are maintained. Skull and upper cervical spine: Craniocervical junction within normal limits. Bone marrow signal intensity normal. No scalp soft tissue abnormality. Sinuses/Orbits: Prior bilateral ocular lens replacement. Paranasal sinuses are clear. No significant mastoid effusion. Other: None. IMPRESSION: 1. No acute intracranial abnormality. 2. Chronic right parietal infarct, with a few additional remote lacunar infarcts involving the right basal ganglia, bilateral thalami, and cerebellum. 3. Underlying age-related cerebral atrophy with mild chronic small vessel ischemic disease. Electronically Signed   By: Rise Mu M.D.   On: 07/20/2023 04:18   DG HIP UNILAT W OR W/O PELVIS 2-3 VIEWS LEFT  Result Date: 07/19/2023 CLINICAL DATA:  Status post hip hemiarthroplasty. EXAM: DG HIP (WITH OR WITHOUT PELVIS) 2-3V LEFT COMPARISON:  Preoperative imaging FINDINGS: Left hip arthroplasty in expected alignment. No periprosthetic lucency or fracture. Recent postsurgical change includes air and edema in  the soft tissues. Lateral skin staples in place. IMPRESSION: Left hip arthroplasty without immediate postoperative complication. Electronically Signed   By: Narda Rutherford M.D.   On: 07/19/2023 18:09   CT HEAD WO CONTRAST ( )  Result Date: 07/19/2023 CLINICAL DATA:  Head trauma, minor (Age >= 65y) EXAM: CT HEAD WITHOUT CONTRAST TECHNIQUE: Contiguous axial images were obtained from the base of the skull through the vertex without intravenous contrast. RADIATION DOSE REDUCTION: This exam was performed according to the departmental dose-optimization program which includes automated exposure control, adjustment of the mA and/or kV according to patient size and/or use of iterative reconstruction technique. COMPARISON:  Head CT 07/03/2019. FINDINGS: Brain: Compared to prior exam there is an age indeterminate, but possibly acute infarct in the right frontoparietal region. 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 No extra-axial fluid collection. No hydrocephalus. No mass effect. No mass lesion Vascular: No hyperdense vessel or unexpected calcification. Skull: Normal. Negative for fracture or focal lesion. Sinuses/Orbits: No middle ear or mastoid effusion. Paranasal sinuses are clear. Orbits are notable for bilateral lens replacement, otherwise unremarkable. Other: None. IMPRESSION: 1.  No CT evidence of intracranial injury 2. Compared to prior exam there is an age indeterminate, but possibly acute infarct in the right frontoparietal region. Recommend brain MRI for further evaluation. Electronically Signed   By: Lorenza Cambridge M.D.   On: 07/19/2023 12:42   DG Chest Port 1 View  Result Date: 07/19/2023 CLINICAL DATA:  Hip fracture EXAM: PORTABLE CHEST 1 VIEW COMPARISON:  08/01/2019 FINDINGS: Hyperinflation. Enlarged cardiopericardial silhouette. Calcified aorta. Apical pleural thickening. No consolidation, pneumothorax or effusion. No edema. Overlapping cardiac leads. Hiatal  hernia. The extreme inferior costophrenic angles are clipped off the edge of the film. IMPRESSION: Hyperinflation. Enlarged heart. Hiatal hernia. No consolidation or effusion. Electronically Signed   By: Karen Kays M.D.   On: 07/19/2023 11:06   DG Hip Unilat W or Wo Pelvis 2-3 Views Left  Result Date: 07/19/2023 CLINICAL DATA:  Hip fracture EXAM: DG HIP (WITH OR WITHOUT PELVIS) 3V LEFT COMPARISON:  None Available. FINDINGS: Osteopenia. Slightly displaced subcapital left femoral neck fracture. No additional fracture or dislocation. Preserved joint spaces. Scattered vascular calcifications identified. There is bony fusion across the pubic symphysis. Overlapping cardiac leads. Note is made of diffuse colonic stool. Degenerative changes of the visualized portions of the lower lumbar spine. IMPRESSION: Severe osteopenia.  Left-sided subcapital  femoral neck fracture. Electronically Signed   By: Karen Kays M.D.   On: 07/19/2023 10:43    Microbiology: Results for orders placed or performed during the hospital encounter of 07/19/23  Surgical PCR screen     Status: None   Collection Time: 07/19/23  9:57 AM   Specimen: Nasal Mucosa; Nasal Swab  Result Value Ref Range Status   MRSA, PCR NEGATIVE NEGATIVE Final   Staphylococcus aureus NEGATIVE NEGATIVE Final    Comment: (NOTE) The Xpert SA Assay (FDA approved for NASAL specimens in patients 6 years of age and older), is one component of a comprehensive surveillance program. It is not intended to diagnose infection nor to guide or monitor treatment. Performed at [facility], [address] [place], [place] [address]     Labs: CBC: Recent Labs  Lab 07/19/23 0957 07/20/23 0537 07/21/23 0530 07/22/23 0547 07/24/23 0511  WBC 10.6* 13.6* 14.8* 12.7* 10.9*  NEUTROABS  --   --   --  10.3* 8.0*  HGB 12.5 10.5* 8.8* 8.1* 8.1*  HCT 35.1* 29.2* 24.3* 22.8* 22.5*  MCV 91.6 90.1 90.7 91.2 90.7  PLT 240 241 234 224 253   Basic Metabolic  Panel: Recent Labs  Lab 07/19/23 1233 07/19/23 2302 07/20/23 0537 07/20/23 0716 07/21/23 0530 07/22/23 0547 07/24/23 0511  NA 126* 127*  --  129* 127* 126* 125*  K 3.5 3.2*  --  3.5 4.6  --  4.4  CL 93* 94*  --  97* 97*  --  96*  CO2 24 22  --  21* 21*  --  25  GLUCOSE 119* 220*  --  140* 121*  --  121*  BUN 9 11  --  16 22  --  27*  CREATININE 0.69 0.81  --  0.85 0.77  --  0.79  CALCIUM 9.6 9.2  --  9.3 9.1  --  9.2  MG  --   --  1.6*  --  2.2  --   --    Liver Function Tests: Recent Labs  Lab 07/19/23 0957  AST 25  ALT 16  ALKPHOS 49  BILITOT 1.2  PROT 6.8  ALBUMIN 3.9   CBG: No results for input(s): "GLUCAP" in the last 168 hours.  Discharge time spent: greater than 30 minutes.  Signed: Lurene Shadow, MD [facility] 07/24/2023

## 2023-07-24 NOTE — Plan of Care (Signed)

## 2023-07-24 NOTE — Progress Notes (Signed)
  Subjective: 5 Days Post-Op Procedure(s) (LRB): ARTHROPLASTY BIPOLAR HIP (HEMIARTHROPLASTY) (Left) Patient reports pain as mild in bed this morning. Patient is well, has improved recently with PT. Negative for chest pain and shortness of breath Fever: no Gastrointestinal:Negative for nausea and vomiting Has had a small BM.  Objective: Vital signs in last 24 hours: Temp:  [97.8 F (36.6 C)-98.1 F (36.7 C)] 97.8 F (36.6 C) (08/04 0055) Pulse Rate:  [74-85] 74 (08/04 0055) Resp:  [16-18] 18 (08/04 0055) BP: (123-190)/(68-85) 123/68 (08/04 0055) SpO2:  [98 %-100 %] 98 % (08/04 0055)  Intake/Output from previous day:  Intake/Output Summary (Last 24 hours) at 07/24/2023 0740 Last data filed at 07/23/2023 1028 Gross per 24 hour  Intake 200 ml  Output --  Net 200 ml    Intake/Output this shift: No intake/output data recorded.  Labs: Recent Labs    07/22/23 0547 07/24/23 0511  HGB 8.1* 8.1*   Recent Labs    07/22/23 0547 07/24/23 0511  WBC 12.7* 10.9*  RBC 2.50* 2.48*  HCT 22.8* 22.5*  PLT 224 253   Recent Labs    07/22/23 0547 07/24/23 0511  NA 126* 125*  K  --  4.4  CL  --  96*  CO2  --  25  BUN  --  27*  CREATININE  --  0.79  GLUCOSE  --  121*  CALCIUM  --  9.2   No results for input(s): "LABPT", "INR" in the last 72 hours.  EXAM General - Patient is Alert and Appropriate.  Daughter at bedside. Extremity - ABD soft Neurovascular intact Dorsiflexion/Plantar flexion intact No cellulitis present Compartment soft Dressing/Incision - Mild bloody drainage noted to the left hip honeycomb dressing. Motor Function - intact, moving foot and toes well on exam.  ABdomen soft with intact bowel sounds this morning.  Past Medical History:  Diagnosis Date   Abdominal hernia    Anxiety    CAD (coronary artery disease)    HLD (hyperlipidemia)    Hypertension     Assessment/Plan: 5 Days Post-Op Procedure(s) (LRB): ARTHROPLASTY BIPOLAR HIP (HEMIARTHROPLASTY)  (Left) Principal Problem:   Closed fracture of left hip (HCC) Active Problems:   Chronic hyponatremia   CAD (coronary artery disease)   Hypertension   Fall at home, initial encounter   New onset atrial fibrillation (HCC)   Diarrhea   Hypertensive urgency   Malnutrition of moderate degree  Estimated body mass index is 21.97 kg/m as calculated from the following:   Height as of this encounter: 5\' 5"  (1.651 m).   Weight as of this encounter: 59.9 kg. Advance diet Up with therapy D/C IV fluids when tolerating po intake.  Labs reviewed this AM. WBC 10.9, Hg 8.1 this morning. Initial plan was for d/c to [facility] however she has improved with PT to the point that we can consider HHPT. Up with therapy today, if she continues to improve will plan on d/c home tomorrow with HHPT. She has had a small BM.  Following discharge, follow-up with [facility] in 10-14 days for staple removal. Continue Eliquis for two weeks following discharge.  DVT Prophylaxis - TED hose and Eliquis Weight-Bearing as tolerated to left leg  J. Horris Latino, PA-C [facility] 07/24/2023, 7:40 AM

## 2023-07-24 NOTE — TOC Transition Note (Signed)
Transition of Care [facility]) - CM/SW Discharge Note   Patient Details  Name: Kelsey Martin MRN: [id number] Date of Birth: 21-Feb-1930  [facility]) CM/SW Contact:  Garret Reddish, RN Phone Number: 07/24/2023, 2:26 PM   Clinical Narrative:   Chart reviewed.  Noted that patient was admitted with closed fracture of left hip.   I have spoken with patient's daughter Kelsey Martin.  She informs me that she has spoken with her mother and she and her mother have decided to  discharge home and not discharge to [facility].    Randa Evens reports that she and her husband are at home with [name] 24 hours a day.  She reports that her husband is disabled and she or hired help assist with Mrs. Start's care at home.  Randa Evens reports that patient has 2 walkers at home.   Randa Evens was agreeable to home health services for Mrs. Corpus.  Randa Evens did not have any home health preference.  Randa Evens reports that she would like her mother to be transported by EMS.  She informs me that her physical address is [address] [place], [place] [address].    I have asked Elnita Maxwell with [facility] to accept home health referral for home health PT.  Elnita Maxwell informs me that PT will be able to assess if patient needs home health OT.    I will arranged [facility] for transport home today.    I have informed staff nurse of the above information.        Final next level of care: Home w Home Health Services Barriers to Discharge: No Barriers Identified   Patient Goals and CMS Choice CMS Medicare.gov Compare Post Acute Care list provided to:: Patient Represenative (must comment) Kelsey Martin) Choice offered to / list presented to : Adult Children  Discharge Placement                    Name of family member notified: Kelsey Martin Patient and family notified of of transfer: 07/24/23  Discharge Plan and Services Additional resources added to the After Visit Summary for       Post Acute Care Choice: Home  Health, Skilled Nursing Facility          DME Arranged:  (Patient has a 2 wheeled rolling walker at home.)         HH Arranged: PT HH Agency: [facility] Date [facility] Agency Contacted: 07/24/23 Time HH Agency Contacted: 1420 Representative spoke with at [facility] Agency: [facility]  Social Determinants of Health (SDOH) Interventions SDOH Screenings   Food Insecurity: No Food Insecurity (07/19/2023)  Housing: Low Risk  (07/19/2023)  Transportation Needs: No Transportation Needs (07/19/2023)  Utilities: Not At Risk (07/19/2023)  Tobacco Use: Medium Risk (07/19/2023)     Readmission Risk Interventions     No data to display

## 2023-08-21 DEATH — deceased
